# Patient Record
Sex: Female | Born: 1945
Health system: Southern US, Community
[De-identification: ages and names within clinical notes are randomized; demographics above are authoritative.]

## PROBLEM LIST (undated history)

## (undated) HISTORY — PX: CATARACT EXTRACTION: SUR2

## (undated) HISTORY — PX: LIPOMA EXCISION: SHX5283

---

## 1999-05-14 ENCOUNTER — Other Ambulatory Visit: Admission: RE | Admit: 1999-05-14 | Discharge: 1999-05-14 | Payer: Self-pay | Admitting: Family Medicine

## 1999-07-19 ENCOUNTER — Ambulatory Visit (HOSPITAL_COMMUNITY): Admission: RE | Admit: 1999-07-19 | Discharge: 1999-07-19 | Payer: Self-pay | Admitting: General Surgery

## 2004-04-26 ENCOUNTER — Other Ambulatory Visit: Admission: RE | Admit: 2004-04-26 | Discharge: 2004-04-26 | Payer: Self-pay | Admitting: Family Medicine

## 2014-09-04 ENCOUNTER — Telehealth: Payer: Self-pay | Admitting: Family Medicine

## 2014-09-04 NOTE — Telephone Encounter (Signed)
Patient was wanting to be seen today for her leg. She states that she felt something pop. Patient advised that we did not have any more appts for today and was offered an appointment for Thursday and she is just going to try urgent care.

## 2014-11-20 ENCOUNTER — Telehealth: Payer: Self-pay | Admitting: Family Medicine

## 2014-11-20 NOTE — Telephone Encounter (Signed)
Pt given appt 11/23/14 at 9:10 with Evelina Dun, pt aware to bring a copy of her insurance card and to arrive 30 minutes prior.

## 2014-11-23 ENCOUNTER — Encounter: Payer: Self-pay | Admitting: Family

## 2014-11-23 ENCOUNTER — Ambulatory Visit (INDEPENDENT_AMBULATORY_CARE_PROVIDER_SITE_OTHER): Payer: Medicare Other | Admitting: Family

## 2014-11-23 VITALS — BP 135/72 | HR 78 | Temp 97.8°F | Ht 62.0 in | Wt 154.4 lb

## 2014-11-23 DIAGNOSIS — S8392XA Sprain of unspecified site of left knee, initial encounter: Secondary | ICD-10-CM | POA: Diagnosis not present

## 2014-11-23 DIAGNOSIS — K6289 Other specified diseases of anus and rectum: Secondary | ICD-10-CM

## 2014-11-23 MED ORDER — MELOXICAM 7.5 MG PO TABS: 7.5000 mg | ORAL_TABLET | Freq: Every day | ORAL | Status: DC

## 2014-11-23 NOTE — Patient Instructions (Signed)
Lateral Collateral Knee Ligament Sprain With Phase I Rehab The lateral collateral ligament (LCL) of the knee helps hold the knee joint in proper alignment and prevents the bones from shifting out of alignment (displacing) toward the outside (laterally). Injury to the knee may cause a tear in the LCL ligament (sprain). The LCL is the least common ligament of the knee to be injured. Sprains may heal on their own, but they often result in a loose joint. Sprains are classified into three categories. Grade 1 sprains cause pain, but the tendon is not lengthened. Grade 2 sprains include a lengthened ligament, due to the ligament being stretched or partially ruptured. With grade 2 sprains there is still function, although the function may be decreased. Grade 3 sprains involve a complete tear of the tendon or muscle, and function is usually impaired. SYMPTOMS   Pain and tenderness on the outer side of the knee.  A "pop," tearing, or pulling sensation at the time of injury.  Bruising (contusion) at the site of injury within 48 hours of injury.  Knee stiffness.  Limping, often walking with the knee bent. CAUSES  An LCL sprain occurs when a force is placed on the ligament that is greater than it can handle. Common causes of injury include:  Direct hit (trauma) to the inner side of the knee, especially if the foot is planted on the ground.  Forceful pivoting of the body and leg while the foot is planted on the ground. RISK INCREASES WITH:  Contact sports (football, rugby).  Sports that require pivoting or cutting (soccer).  Poor knee strength and flexibility.  Improper equipment use. PREVENTION   Warm up and stretch properly before activity.  Maintain physical fitness:  Strength, flexibility, and endurance.  Cardiovascular fitness.  Wear properly fitted protective equipment (correct length of cleats for surface).  Functional braces may be effective in preventing injury. PROGNOSIS  If  treated properly, LCL tears usually heal on their own. Sometimes, surgery is required. RELATED COMPLICATIONS   Frequently recurring symptoms, such as knee giving way, instability, and swelling.  Injury to other structures in the knee joint.  Meniscal cartilage, resulting in locking and swelling of the knee.  Articular cartilage, resulting in knee arthritis.  Other ligaments of the knee (commonly).  Injury to nerves, causing numbness of the outer leg, foot, and ankle and weakness or paralysis, with inability to raise the ankle, big toe, or lesser toes.  Knee stiffness (loss of knee motion). TREATMENT  Treatment first involves the use of ice and medicine to reduce pain and inflammation. The use of strengthening and stretching exercises may help reduce pain with activity. These exercises may be performed at home, but referral to a therapist is often advised. You may be advised to walk with crutches until you are able to walk without a limp. Your caregiver may provide you with a hinged knee brace to help regain a full range of motion while also protecting the injured knee. For severe LCL injuries, or injuries that involve other ligaments of the knee, surgery is often advised. MEDICATION   If pain medicine is needed, nonsteroidal anti-inflammatory medicines (aspirin and ibuprofen), or other minor pain relievers (acetaminophen), are often advised.  Do not take pain medicine for 7 days before surgery.  Prescription pain relievers may be given, if your caregiver thinks they are needed. Use only as directed and only as much as you need. HEAT AND COLD  Cold treatment (icing) should be applied for 10 to 15 minutes  every 2 to 3 hours for inflammation and pain, and immediately after activity that aggravates your symptoms. Use ice packs or an ice massage.  Heat treatment may be used before performing stretching and strengthening activities prescribed by your caregiver, physical therapist, or athletic  trainer. Use a heat pack or a warm water soak. SEEK MEDICAL CARE IF:   Symptoms get worse or do not improve in 4 to 6 weeks, despite treatment.  New, unexplained symptoms develop. (Drugs used in treatment may produce side effects.) EXERCISES RANGE OF MOTION (ROM) AND STRETCHING EXERCISES - Lateral Collateral Knee Ligament Sprain Phase I These are some of the initial exercises that your physician, physical therapist or athletic trainer may have you perform to begin your rehabilitation. When you demonstrate gains in your flexibility and strength, your caregiver may progress you to Phase II exercises. As you perform these exercises, remember:   These initial exercises are intended to be gentle. They will help you restore motion without increasing any swelling.  Completing these exercises allows less painful movement and prepares you for the more aggressive strengthening exercises in Phase II.  An effective stretch should be held for at least 30 seconds.  A stretch should never be painful. You should only feel a gentle lengthening or release in the stretched tissue. RANGE OF MOTION - Knee Flexion, Active  Lie on your back with both knees straight. (If this causes back discomfort, bend your opposite knee, placing your foot flat on the floor.)  Slowly slide your heel back toward your buttocks until you feel a gentle stretch in the front of your knee or thigh.  Hold for __________ seconds. Slowly slide your heel back to the starting position. Repeat __________ times. Complete this exercise __________ times per day.  STRETCH - Knee Flexion, Supine  Lie on the floor with your right / left heel and foot lightly touching the wall. (Place both feet on the wall, if you do not use a door frame.)  Without using any effort, allow gravity to slide your foot down the wall slowly until you feel a gentle stretch in the front of your right / left knee.  Hold this stretch for __________ seconds. Then return  the leg to the starting position, using your healthy leg for help, if needed. Repeat __________ times. Complete this stretch __________ times per day.  RANGE OF MOTION - Knee Flexion and Extension, Active-Assisted  Sit on the edge of a table or chair with your thighs firmly supported. It may be helpful to place a folded towel under the end of your right / left thigh.  Flexion (bending): Place the ankle of your healthy leg on top of the other ankle. Use your healthy leg to gently bend your right / left knee until you feel a mild tension across the top of your knee.  Hold for __________ seconds.  Extension (straightening): Switch your ankles so your right / left leg is on top. Use your healthy leg to straighten your right / left knee until you feel a mild tension on the backside of your knee.  Hold for __________ seconds. Repeat __________ times. Complete this exercise __________ times per day. STRETCH - Knee Extension Sitting  Sit with your right / left leg/heel propped on another chair, coffee table, or foot stool.  Allow your leg muscles to relax, letting gravity straighten out your knee.*  You should feel a stretch behind your right / left knee. Hold this position for __________ seconds. Repeat __________ times.  Complete this stretch __________ times per day.  *Your physician, physical therapist, or athletic trainer may instruct you place a __________ weight on your thigh, just above your kneecap, to deepen the stretch.  STRENGTHENING EXERCISES Lateral Collateral Knee Ligament Sprain - Phase I These exercises may help you when beginning to rehabilitate your injury. They may resolve your symptoms with or without further involvement from your physician, physical therapist, or athletic trainer. While completing these exercises, remember:   Muscles can gain both the endurance and the strength needed for everyday activities through controlled exercises.  Complete these exercises as  instructed by your physician, physical therapist or athletic trainer. Increase the resistance and repetitions only as guided.  In order to return to more demanding activities, you will likely need to progress to more challenging exercises. Your physician, physical therapist or athletic trainer will advance your exercises when your tissues show adequate healing and your muscles demonstrate increased strength. STRENGTH - Quadriceps, Isometrics  Lie on your back with your right / left leg extended and your opposite knee bent.  Gradually tense the muscles in the front of your right / left thigh. You should see either your kneecap slide up toward your hip or increased dimpling just above the knee. This motion will push the back of the knee down toward the floor, mat, or bed on which you are lying.  Hold the muscle as tight as you can without increasing your pain for __________ seconds.  Relax the muscles slowly and completely between each repetition. Repeat __________ times. Complete this exercise __________ times per day.  STRENGTH - Quadriceps, Short Arcs   Lie on your back. Place a __________ inch towel roll under your right / left knee, so that the knee bends slightly.  Raise only your lower leg by tightening the muscles in the front of your thigh. Do not allow your thigh to rise.  Hold this position for __________ seconds. Repeat __________ times. Complete this exercise __________ times per day.  OPTIONAL ANKLE WEIGHTS: Begin with ____________________, but DO NOT exceed ____________________. Increase in 1 pound/0.5 kilogram increments. STRENGTH - Quadriceps, Straight Leg Raises  Quality counts! Watch for signs that the quadriceps muscle is working, to be sure you are strengthening the correct muscles and not "cheating" by substituting with healthier muscles.  Lie on your back with your right / left leg extended and your opposite knee bent.  Tense the muscles in the front of your right /  left thigh. You should see either your kneecap slide up or increased dimpling just above the knee. Your thigh may even shake a bit.  Tighten these muscles even more and raise your leg 4 to 6 inches off the floor. Hold for __________ seconds.  Keeping these muscles tense, lower your leg.  Relax the muscles slowly and completely in between each repetition. Repeat __________ times. Complete this exercise __________ times per day.  STRENGTH - Hamstring, Isometrics   Lie on your back, on a firm surface.  Bend your right / left knee approximately __________ degrees.  Dig your heel into the surface as if you are trying to pull it toward your buttocks. Tighten the muscles in the back of your thighs to "dig" as hard as you can, without increasing any pain.  Hold this position for __________ seconds.  Release the tension gradually and allow your muscle to completely relax for __________ seconds in between each exercise. Repeat __________ times. Complete this exercise __________ times per day.  STRENGTH - Hamstring,  Curls   Lie on your stomach with your legs extended. (If you lie on a bed, your feet may hang over the edge.)  Tighten the muscles in the back of your thigh to bend your right / left knee up to 90 degrees. Keep your hips flat on the bed.  Hold this position for __________ seconds.  Slowly lower your leg back to the starting position. Repeat __________ times. Complete this exercise __________ times per day.  OPTIONAL ANKLE WEIGHTS: Begin with ____________________, but DO NOT exceed ____________________. Increase in 1 pound/0.5 kilogram increments.   This information is not intended to replace advice given to you by your health care provider. Make sure you discuss any questions you have with your health care provider.   Document Released: 01/20/2005 Document Revised: 02/10/2014 Document Reviewed: 05/04/2008 Elsevier Interactive Patient Education [employer].

## 2014-11-23 NOTE — Progress Notes (Signed)
   Subjective:    Patient ID: Krista Price, female    DOB: Nov 15, 1945, 69 y.o.   MRN: [id number]    HPI Pt presents to the office today to establish care and left knee pain. Pt states on August 1 she twisted her knee going down the stairs. Pt went to the Urgent Care and had a negative x-ray. Pt went to [facility] and was told to rest. Pt states a doctor at her church told her she had fluid in it. Pt states she is having aching pain of a 2 out 10.   Review of Systems  Constitutional: Negative.   HENT: Negative.   Eyes: Negative.   Respiratory: Negative.  Negative for shortness of breath.   Cardiovascular: Negative.  Negative for palpitations.  Gastrointestinal: Negative.   Endocrine: Negative.   Genitourinary: Negative.   Musculoskeletal: Negative.   Neurological: Negative.  Negative for headaches.  Hematological: Negative.   Psychiatric/Behavioral: Negative.   All other systems reviewed and are negative.      Objective:   Physical Exam  Constitutional: She is oriented to person, place, and time. She appears well-developed and well-nourished. No distress.  HENT:  Head: Normocephalic and atraumatic.  Right Ear: External ear normal.  Mouth/Throat: Oropharynx is clear and moist.  Eyes: Pupils are equal, round, and reactive to light.  Neck: Normal range of motion. Neck supple. No thyromegaly present.  Cardiovascular: Normal rate, regular rhythm, normal heart sounds and intact distal pulses.   No murmur heard. Pulmonary/Chest: Effort normal and breath sounds normal. No respiratory distress. She has no wheezes.  Abdominal: Soft. Bowel sounds are normal. She exhibits no distension. There is no tenderness.  Musculoskeletal: Normal range of motion. She exhibits edema (trace amount of swelling in left knee). She exhibits no tenderness.  Right inner thigh mass approx 14cmX7cm  Neurological: She is alert and oriented to person, place, and time. She has normal reflexes. No  cranial nerve deficit.  Skin: Skin is warm and dry.  Psychiatric: She has a normal mood and affect. Her behavior is normal. Judgment and thought content normal.  Vitals reviewed.   BP 135/72 mmHg  Pulse 78  Temp(Src) 97.8 F (36.6 C) (Oral)  Ht $R'5\' 2"'EQ$  (1.575 m)  Wt 154 lb 6.4 oz (70.035 kg)  BMI 28.23 kg/m2       Assessment & Plan:  1. Knee sprain, left, initial encounter -Take Mobic with food -Exercises given to patient -Keep elevated when sitting -RTO in 2 weeks - CMP14+EGFR - meloxicam (MOBIC) 7.5 MG tablet; Take 1 tablet (7.5 mg total) by mouth daily.  Dispense: 30 tablet; Refill: 0  2. Mass of perirectal soft tissue Ultrasound pending - [place] Misc Soft Tissue; Future  Evelina Dun, FNP

## 2014-11-24 ENCOUNTER — Telehealth: Payer: Self-pay

## 2014-11-24 LAB — CMP14+EGFR
ALT: 27 IU/L (ref 0–32)
AST: 20 IU/L (ref 0–40)
Albumin/Globulin Ratio: 2 (ref 1.1–2.5)
Albumin: 4.7 g/dL (ref 3.5–4.8)
Alkaline Phosphatase: 79 IU/L (ref 39–117)
BILIRUBIN TOTAL: 0.4 mg/dL (ref 0.0–1.2)
BUN/Creatinine Ratio: 23 (ref 11–26)
BUN: 15 mg/dL (ref 8–27)
CHLORIDE: 99 mmol/L (ref 97–106)
CO2: 24 mmol/L (ref 18–29)
Calcium: 9.9 mg/dL (ref 8.7–10.3)
Creatinine, Ser: 0.64 mg/dL (ref 0.57–1.00)
GFR calc non Af Amer: 91 mL/min/{1.73_m2} (ref >59)
GFR, EST AFRICAN AMERICAN: 105 mL/min/{1.73_m2} (ref >59)
GLUCOSE: 76 mg/dL (ref 65–99)
Globulin, Total: 2.4 g/dL (ref 1.5–4.5)
POTASSIUM: 4.5 mmol/L (ref 3.5–5.2)
Sodium: 140 mmol/L (ref 136–144)
TOTAL PROTEIN: 7.1 g/dL (ref 6.0–8.5)

## 2014-11-24 NOTE — Telephone Encounter (Signed)
[facility] to x-ray Pt has an appointment 11/30/14 at 1:30 at [facility]; Arrive at 1:15 for registration; No prep

## 2014-11-24 NOTE — Telephone Encounter (Signed)
Pt aware of appointment date/time 

## 2014-11-30 ENCOUNTER — Ambulatory Visit (HOSPITAL_COMMUNITY)
Admission: RE | Admit: 2014-11-30 | Discharge: 2014-11-30 | Disposition: A | Discharge status: Home / Self Care | Payer: Medicare Other | Source: Ambulatory Visit | Attending: Family | Admitting: Family

## 2014-11-30 DIAGNOSIS — K6289 Other specified diseases of anus and rectum: Secondary | ICD-10-CM | POA: Diagnosis present

## 2014-12-01 ENCOUNTER — Telehealth: Payer: Self-pay | Admitting: Family

## 2014-12-01 ENCOUNTER — Other Ambulatory Visit: Payer: Self-pay | Admitting: Family

## 2014-12-02 NOTE — Telephone Encounter (Signed)
Notified pt of results of Ultrasound

## 2014-12-07 ENCOUNTER — Ambulatory Visit (INDEPENDENT_AMBULATORY_CARE_PROVIDER_SITE_OTHER): Payer: Medicare Other

## 2014-12-07 ENCOUNTER — Encounter: Payer: Self-pay | Admitting: Family

## 2014-12-07 ENCOUNTER — Ambulatory Visit (INDEPENDENT_AMBULATORY_CARE_PROVIDER_SITE_OTHER): Payer: Medicare Other | Admitting: Family

## 2014-12-07 VITALS — BP 119/63 | HR 78 | Temp 97.6°F | Ht 62.0 in | Wt 144.4 lb

## 2014-12-07 DIAGNOSIS — S8392XD Sprain of unspecified site of left knee, subsequent encounter: Secondary | ICD-10-CM | POA: Diagnosis not present

## 2014-12-07 DIAGNOSIS — Z780 Asymptomatic menopausal state: Secondary | ICD-10-CM

## 2014-12-07 DIAGNOSIS — D1739 Benign lipomatous neoplasm of skin and subcutaneous tissue of other sites: Secondary | ICD-10-CM

## 2014-12-07 DIAGNOSIS — D1720 Benign lipomatous neoplasm of skin and subcutaneous tissue of unspecified limb: Secondary | ICD-10-CM

## 2014-12-07 MED ORDER — KETOROLAC TROMETHAMINE 30 MG/ML IJ SOLN
30.0000 mg | Freq: Once | INTRAMUSCULAR | Status: AC
  Administered 2014-12-07: 30 mg via INTRAMUSCULAR

## 2014-12-07 MED ORDER — METHYLPREDNISOLONE ACETATE 80 MG/ML IJ SUSP
80.0000 mg | Freq: Once | INTRAMUSCULAR | Status: AC
  Administered 2014-12-07: 80 mg via INTRAMUSCULAR

## 2014-12-07 NOTE — Progress Notes (Signed)
   Subjective:    Patient ID: Krista Price, female    DOB: 10-19-1945, 69 y.o.   MRN: [id number]   HPI  Pt presents to the office today to discuss left knee pain. Pt was seen in the office on 11/23/14 and was diagnosed with left knee sprain and was given mobic 7.5 mg and told to rest. Pt reports feeling better, but still has pain at times when she is walking. Pt states her pain is a 2 out 10. Pt states when she does a lot of walking her pain increases. Pt states she goes to the [facility] and works out 30 mins two times a week.   She would also like to discuss her ultrasound results. Pt had an ultrasound of right inner thigh for that was a lipoma. Pt states she would like to has it removed.   Review of Systems  Constitutional: Negative.   HENT: Negative.   Eyes: Negative.   Respiratory: Negative.  Negative for shortness of breath.   Cardiovascular: Negative.  Negative for palpitations.  Gastrointestinal: Negative.   Endocrine: Negative.   Genitourinary: Negative.   Musculoskeletal: Negative.   Neurological: Negative.  Negative for headaches.  Hematological: Negative.   Psychiatric/Behavioral: Negative.   All other systems reviewed and are negative.      Objective:   Physical Exam  Constitutional: She is oriented to person, place, and time. She appears well-developed and well-nourished. No distress.  HENT:  Head: Normocephalic and atraumatic.  Right Ear: External ear normal.  Mouth/Throat: Oropharynx is clear and moist.  Eyes: Pupils are equal, round, and reactive to light.  Neck: Normal range of motion. Neck supple. No thyromegaly present.  Cardiovascular: Normal rate, regular rhythm, normal heart sounds and intact distal pulses.   No murmur heard. Pulmonary/Chest: Effort normal and breath sounds normal. No respiratory distress. She has no wheezes.  Abdominal: Soft. Bowel sounds are normal. She exhibits no distension. There is no tenderness.  Musculoskeletal: Normal range of  motion. She exhibits no edema or tenderness.  Neurological: She is alert and oriented to person, place, and time. She has normal reflexes. No cranial nerve deficit.  Skin: Skin is warm and dry.  Psychiatric: She has a normal mood and affect. Her behavior is normal. Judgment and thought content normal.  Vitals reviewed.   BP 119/63 mmHg  Pulse 78  Temp(Src) 97.6 F (36.4 C) (Oral)  Ht 5\' 2"  (1.575 m)  Wt 144 lb 6.4 oz (65.499 kg)  BMI 26.40 kg/m2       Assessment & Plan:  1. Left knee sprain, subsequent encounter -Rest -Exercises discussed -Continue with mobic -Pt declines PT at this time - ketorolac (TORADOL) 30 MG/ML injection 30 mg; Inject 1 mL (30 mg total) into the muscle once. - methylPREDNISolone acetate (DEPO-MEDROL) injection 80 mg; Inject 1 mL (80 mg total) into the muscle once.  2. Post-menopausal - DG Bone Density; Future  3. Lipoma of thigh - Ambulatory referral to General Surgery   Continue all meds Health Maintenance reviewed- Refuses all vaccines today Diet and exercise encouraged RTO as needed   Evelina Dun, FNP

## 2014-12-07 NOTE — Patient Instructions (Addendum)
Generic Knee Exercises EXERCISES RANGE OF MOTION (ROM) AND STRETCHING EXERCISES These exercises may help you when beginning to rehabilitate your injury. Your symptoms may resolve with or without further involvement from your physician, physical therapist, or athletic trainer. While completing these exercises, remember:   Restoring tissue flexibility helps normal motion to return to the joints. This allows healthier, less painful movement and activity.  An effective stretch should be held for at least 30 seconds.  A stretch should never be painful. You should only feel a gentle lengthening or release in the stretched tissue. STRETCH - Knee Extension, Prone  Lie on your stomach on a firm surface, such as a bed or countertop. Place your right / left knee and leg just beyond the edge of the surface. You may wish to place a towel under the far end of your right / left thigh for comfort.  Relax your leg muscles and allow gravity to straighten your knee. Your clinician may advise you to add an ankle weight if more resistance is helpful for you.  You should feel a stretch in the back of your right / left knee. Hold this position for __________ seconds. Repeat __________ times. Complete this stretch __________ times per day. * Your physician, physical therapist, or athletic trainer may ask you to add ankle weight to enhance your stretch.  RANGE OF MOTION - Knee Flexion, Active  Lie on your back with both knees straight. (If this causes back discomfort, bend your opposite knee, placing your foot flat on the floor.)  Slowly slide your heel back toward your buttocks until you feel a gentle stretch in the front of your knee or thigh.  Hold for __________ seconds. Slowly slide your heel back to the starting position. Repeat __________ times. Complete this exercise __________ times per day.  STRETCH - Quadriceps, Prone   Lie on your stomach on a firm surface, such as a bed or padded floor.  Bend your  right / left knee and grasp your ankle. If you are unable to reach your ankle or pant leg, use a belt around your foot to lengthen your reach.  Gently pull your heel toward your buttocks. Your knee should not slide out to the side. You should feel a stretch in the front of your thigh and/or knee.  Hold this position for __________ seconds. Repeat __________ times. Complete this stretch __________ times per day.  STRETCH - Hamstrings, Supine   Lie on your back. Loop a belt or towel over the ball of your right / left foot.  Straighten your right / left knee and slowly pull on the belt to raise your leg. Do not allow the right / left knee to bend. Keep your opposite leg flat on the floor.  Raise the leg until you feel a gentle stretch behind your right / left knee or thigh. Hold this position for __________ seconds. Repeat __________ times. Complete this stretch __________ times per day.  STRENGTHENING EXERCISES These exercises may help you when beginning to rehabilitate your injury. They may resolve your symptoms with or without further involvement from your physician, physical therapist, or athletic trainer. While completing these exercises, remember:   Muscles can gain both the endurance and the strength needed for everyday activities through controlled exercises.  Complete these exercises as instructed by your physician, physical therapist, or athletic trainer. Progress the resistance and repetitions only as guided.  You may experience muscle soreness or fatigue, but the pain or discomfort you are trying to   eliminate should never worsen during these exercises. If this pain does worsen, stop and make certain you are following the directions exactly. If the pain is still present after adjustments, discontinue the exercise until you can discuss the trouble with your clinician. STRENGTH - Quadriceps, Isometrics  Lie on your back with your right / left leg extended and your opposite knee  bent.  Gradually tense the muscles in the front of your right / left thigh. You should see either your knee cap slide up toward your hip or increased dimpling just above the knee. This motion will push the back of the knee down toward the floor/mat/bed on which you are lying.  Hold the muscle as tight as you can without increasing your pain for __________ seconds.  Relax the muscles slowly and completely in between each repetition. Repeat __________ times. Complete this exercise __________ times per day.  STRENGTH - Quadriceps, Short Arcs   Lie on your back. Place a __________ inch towel roll under your knee so that the knee slightly bends.  Raise only your lower leg by tightening the muscles in the front of your thigh. Do not allow your thigh to rise.  Hold this position for __________ seconds. Repeat __________ times. Complete this exercise __________ times per day.  OPTIONAL ANKLE WEIGHTS: Begin with ____________________, but DO NOT exceed ____________________. Increase in 1 pound/0.5 kilogram increments.  STRENGTH - Quadriceps, Straight Leg Raises  Quality counts! Watch for signs that the quadriceps muscle is working to insure you are strengthening the correct muscles and not "cheating" by substituting with healthier muscles.  Lay on your back with your right / left leg extended and your opposite knee bent.  Tense the muscles in the front of your right / left thigh. You should see either your knee cap slide up or increased dimpling just above the knee. Your thigh may even quiver.  Tighten these muscles even more and raise your leg 4 to 6 inches off the floor. Hold for __________ seconds.  Keeping these muscles tense, lower your leg.  Relax the muscles slowly and completely in between each repetition. Repeat __________ times. Complete this exercise __________ times per day.  STRENGTH - Hamstring, Curls  Lay on your stomach with your legs extended. (If you lay on a bed, your feet  may hang over the edge.)  Tighten the muscles in the back of your thigh to bend your right / left knee up to 90 degrees. Keep your hips flat on the bed/floor.  Hold this position for __________ seconds.  Slowly lower your leg back to the starting position. Repeat __________ times. Complete this exercise __________ times per day.  OPTIONAL ANKLE WEIGHTS: Begin with ____________________, but DO NOT exceed ____________________. Increase in 1 pound/0.5 kilogram increments.  STRENGTH - Quadriceps, Squats  Stand in a door frame so that your feet and knees are in line with the frame.  Use your hands for balance, not support, on the frame.  Slowly lower your weight, bending at the hips and knees. Keep your lower legs upright so that they are parallel with the door frame. Squat only within the range that does not increase your knee pain. Never let your hips drop below your knees.  Slowly return upright, pushing with your legs, not pulling with your hands. Repeat __________ times. Complete this exercise __________ times per day.  STRENGTH - Quadriceps, Wall Slides  Follow guidelines for form closely. Increased knee pain often results from poorly placed feet or knees.    Lean against a smooth wall or door and walk your feet out 18-24 inches. Place your feet hip-width apart.  Slowly slide down the wall or door until your knees bend __________ degrees.* Keep your knees over your heels, not your toes, and in line with your hips, not falling to either side.  Hold for __________ seconds. Stand up to rest for __________ seconds in between each repetition. Repeat __________ times. Complete this exercise __________ times per day. * Your physician, physical therapist, or athletic trainer will alter this angle based on your symptoms and progress.   This information is not intended to replace advice given to you by your health care provider. Make sure you discuss any questions you have with your health care  provider.   Document Released: 12/04/2004 Document Revised: 02/10/2014 Document Reviewed: 05/04/2008 Elsevier Interactive Patient Education 2016 [employer] Lipoma A lipoma is a noncancerous (benign) tumor that is made up of fat cells. This is a very common type of soft-tissue growth. Lipomas are usually found under the skin (subcutaneous). They may occur in any tissue of the body that contains fat. Common areas for lipomas to appear include the back, shoulders, buttocks, and thighs. Lipomas grow slowly, and they are usually painless. Most lipomas do not cause problems and do not require treatment. CAUSES The cause of this condition is not known. RISK FACTORS This condition is more likely to develop in:  People who are 34-82 years old.  People who have a family history of lipomas. SYMPTOMS A lipoma usually appears as a small, round bump under the skin. It may feel soft or rubbery, but the firmness can vary. Most lipomas are not painful. However, a lipoma may become painful if it is located in an area where it pushes on nerves. DIAGNOSIS A lipoma can usually be diagnosed with a physical exam. You may also have tests to confirm the diagnosis and to rule out other conditions. Tests may include:  Imaging tests, such as a CT scan or MRI.  Removal of a tissue sample to be looked at under a microscope (biopsy). TREATMENT Treatment is not needed for small lipomas that are not causing problems. If a lipoma continues to get bigger or it causes problems, removal is often the best option. Lipomas can also be removed to improve appearance. Removal of a lipoma is usually done with a surgery in which the fatty cells and the surrounding capsule are removed. Most often, a medicine that numbs the area (local anesthetic) is used for this procedure. HOME CARE INSTRUCTIONS  Keep all follow-up visits as directed by your health care provider. This is important. SEEK MEDICAL CARE IF:  Your lipoma becomes  larger or hard.  Your lipoma becomes painful, red, or increasingly swollen. These could be signs of infection or a more serious condition.   This information is not intended to replace advice given to you by your health care provider. Make sure you discuss any questions you have with your health care provider.   Document Released: 01/10/2002 Document Revised: 06/06/2014 Document Reviewed: 01/16/2014 Elsevier Interactive Patient Education [employer].

## 2014-12-15 ENCOUNTER — Encounter: Payer: Self-pay | Admitting: Pharmacist

## 2014-12-15 ENCOUNTER — Ambulatory Visit (INDEPENDENT_AMBULATORY_CARE_PROVIDER_SITE_OTHER): Payer: Medicare Other | Admitting: Pharmacist

## 2014-12-15 VITALS — Ht 62.0 in | Wt 144.5 lb

## 2014-12-15 DIAGNOSIS — M8580 Other specified disorders of bone density and structure, unspecified site: Secondary | ICD-10-CM

## 2014-12-15 NOTE — Patient Instructions (Signed)
Fall Prevention in the Home  Falls can cause injuries and can affect people from all age groups. There are many simple things that you can do to make your home safe and to help prevent falls. WHAT CAN I DO ON THE OUTSIDE OF MY HOME?  Regularly repair the edges of walkways and driveways and fix any cracks.  Remove high doorway thresholds.  Trim any shrubbery on the main path into your home.  Use bright outdoor lighting.  Clear walkways of debris and clutter, including tools and rocks.  Regularly check that handrails are securely fastened and in good repair. Both sides of any steps should have handrails.  Install guardrails along the edges of any raised decks or porches.  Have leaves, snow, and ice cleared regularly.  Use sand or salt on walkways during winter months.  In the garage, clean up any spills right away, including grease or oil spills. WHAT CAN I DO IN THE BATHROOM?  Use night lights.  Install grab bars by the toilet and in the tub and shower. Do not use towel bars as grab bars.  Use non-skid mats or decals on the floor of the tub or shower.  If you need to sit down while you are in the shower, use a plastic, non-slip stool..  Keep the floor dry. Immediately clean up any water that spills on the floor.  Remove soap buildup in the tub or shower on a regular basis.  Attach bath mats securely with double-sided non-slip rug tape.  Remove throw rugs and other tripping hazards from the floor. WHAT CAN I DO IN THE BEDROOM?  Use night lights.  Make sure that a bedside light is easy to reach.  Do not use oversized bedding that drapes onto the floor.  Have a firm chair that has side arms to use for getting dressed.  Remove throw rugs and other tripping hazards from the floor. WHAT CAN I DO IN THE KITCHEN?   Clean up any spills right away.  Avoid walking on wet floors.  Place frequently used items in easy-to-reach places.  If you need to reach for something  above you, use a sturdy step stool that has a grab bar.  Keep electrical cables out of the way.  Do not use floor polish or wax that makes floors slippery. If you have to use wax, make sure that it is non-skid floor wax.  Remove throw rugs and other tripping hazards from the floor. WHAT CAN I DO IN THE STAIRWAYS?  Do not leave any items on the stairs.  Make sure that there are handrails on both sides of the stairs. Fix handrails that are broken or loose. Make sure that handrails are as long as the stairways.  Check any carpeting to make sure that it is firmly attached to the stairs. Fix any carpet that is loose or worn.  Avoid having throw rugs at the top or bottom of stairways, or secure the rugs with carpet tape to prevent them from moving.  Make sure that you have a light switch at the top of the stairs and the bottom of the stairs. If you do not have them, have them installed. WHAT ARE SOME OTHER FALL PREVENTION TIPS?  Wear closed-toe shoes that fit well and support your feet. Wear shoes that have rubber soles or low heels.  When you use a stepladder, make sure that it is completely opened and that the sides are firmly locked. Have someone hold the ladder while you   are using it. Do not climb a closed stepladder.  Add color or contrast paint or tape to grab bars and handrails in your home. Place contrasting color strips on the first and last steps.  Use mobility aids as needed, such as canes, walkers, scooters, and crutches.  Turn on lights if it is dark. Replace any light bulbs that burn out.  Set up furniture so that there are clear paths. Keep the furniture in the same spot.  Fix any uneven floor surfaces.  Choose a carpet design that does not hide the edge of steps of a stairway.  Be aware of any and all pets.  Review your medicines with your healthcare provider. Some medicines can cause dizziness or changes in blood pressure, which increase your risk of falling. Talk  with your health care provider about other ways that you can decrease your risk of falls. This may include working with a physical therapist or trainer to improve your strength, balance, and endurance.   This information is not intended to replace advice given to you by your health care provider. Make sure you discuss any questions you have with your health care provider.   Document Released: 01/10/2002 Document Revised: 06/06/2014 Document Reviewed: 02/24/2014 Elsevier Interactive Patient Education 2016 [employer] Inc[employer]                Exercise for Strong Bones  Exercise is important to build and maintain strong bones / bone density.  There are 2 types of exercises that are important to building and maintaining strong bones:  Weight- bearing and muscle-stregthening.  Weight-bearing Exercises  These exercises include activities that make you move against gravity while staying upright. Weight-bearing exercises can be high-impact or low-impact.  High-impact weight-bearing exercises help build bones and keep them strong. If you have broken a bone due to osteoporosis or are at risk of breaking a bone, you may need to avoid high-impact exercises. If you're not sure, you should check with your healthcare provider.  Examples of high-impact weight-bearing exercises are: Dancing  Doing high-impact aerobics  Hiking  Jogging/running  Jumping Rope  Stair climbing  Tennis  Low-impact weight-bearing exercises can also help keep bones strong and are a safe alternative if you cannot do high-impact exercises.   Examples of low-impact weight-bearing exercises are: Using elliptical training machines  Doing low-impact aerobics  Using stair-step machines  Fast walking on a treadmill or outside   Muscle-Strengthening Exercises These exercises include activities where you move your body, a weight or some other resistance against gravity. They are also known as resistance exercises and include: Lifting  weights  Using elastic exercise bands  Using weight machines  Lifting your own body weight  Functional movements, such as standing and rising up on your toes  Yoga and Pilates can also improve strength, balance and flexibility. However, certain positions may not be safe for people with osteoporosis or those at increased risk of broken bones. For example, exercises that have you bend forward may increase the chance of breaking a bone in the spine.   Non-Impact Exercises There are other types of exercises that can help prevent falls.  Non-impact exercises can help you to improve balance, posture and how well you move in everyday activities. Some of these exercises include: Balance exercises that strengthen your legs and test your balance, such as Tai Chi, can decrease your risk of falls.  Posture exercises that improve your posture and reduce rounded or "sloping" shoulders can help you decrease the chance   breaking a bone, especially in the spine.  Functional exercises that improve how well you move can help you with everyday activities and decrease your chance of falling and breaking a bone. For example, if you have trouble getting up from a chair or climbing stairs, you should do these activities as exercises.   **A physical therapist can teach you balance, posture and functional exercises. He/she can also help you learn which exercises are safe and appropriate for you.  [facility] has a physical therapy office in [place] in front of our office and referrals can be made for assessments and treatment as needed and strength and balance training.  If you would like to have an assessment with [facility] and our physical therapy team please let a nurse or provider know.  Calcium & Vitamin D: The Facts  Why is calcium and vitamin D consumption important? Calcium: . Most Americans do not consume adequate amounts of calcium! Calcium is required for proper muscle function, nerve communication, bone  support, and many other functions in the body.  . The body uses bones as a source of calcium. Bones 'remodel' themselves continuously - the body constantly breaks bone down to release calcium and rebuilds bones by replacing calcium in the bone later.  . As we get older, the rate of bone breakdown occurs faster than bone rebuilding which could lead to osteopenia, osteoporosis, and possible fractures.   Vitamin D: . People naturally make vitamin D in the body when sunlight hits the skin and triggers a process that leads to vitamin D production. This natural vitamin D production requires about 10-15 minutes of sun exposure on the hands, arms, and face at least 2-3 times per week. However, due to decreased sun exposure and the use of sunscreen, most people will need to get additional vitamin D from foods or supplements. Your doctor can measure your body's vitamin D level through a simple blood test to determine your daily vitamin D needs.  . Vitamin D is used to help the body absorb calcium, maintain bone health, help the immune system, and reduce inflammation. It also plays a role in muscle performance, balance and risk of falling.  . Vitamin D deficiency can lead to osteomalacia or softening of the bones, bone pain, and muscle weakness.   The recommended daily allowance of Calcium and Vitamin D varies for different age groups. Age group Calcium (mg) Vitamin D (IU)  Females and Males: Age 47-50 1000 mg 600 IU  Females: Age 86- 52 1200 mg 600 IU  Males: Age 39-70 1000 mg 600 IU  Females and Males: Age 60+ 1200 mg 800 IU  Pregnant/lactating Females age 61-50 1000 mg 600 IU   How much Calcium do you get in your diet? Calcium Intake # of servings per day  Total calcium (mg)  Skim milk, 2% milk (1 cup) _________ x 300 mg   Yogurt (1 small container) _________ x 200 mg   Cheese (1oz) _________ x 200 mg   Cottage Cheese (1 cup)             ________ x 150 mg   Almond milk (1 cup) _________ x 450 mg    Fortified Orange Juice (1 cup) _________ x 300 mg   Broccoli or spinach ( 1 cup) _________ x 100 mg   Salmon (3 oz) _________ x 150 mg    Almonds (1/4 cup) _______ x 90 mg      How do we get Calcium and Vitamin D in  our diet? Calcium: . Obtaining calcium from the diet is the most preferred way to reach the recommended daily goal. If this goal is not reached through diet, calcium supplements are available.  . Calcium is found in many foods including: dairy products, dark leafy vegetables (like broccoli, kale, and spinach), fish, and fortified products like juices and cereals.  . The food label will have a %DV (percent daily value) listed showing the amount of calcium per serving. To determine the total mg per serving, simply replace the % with zero (0).  For example, Almond Breeze almond milk contains 45% DV of calcium or 485m per 1 cup.  . You can increase the amount of calcium in your diet by using more calcium products in your daily meals. Use yogurt and fruit to make smoothies or use yogurt to top baked potatoes or make whipped potatoes. Sprinkle low fat cheese onto salads or into egg white omelets. You can even add non-fat dry milk powder (3025mcalcium per 1/3 cup) to hot cereals, meat loaf, soups, or potatoes.  . Calcium supplements come in many forms including tablets, chewables, and gummies. Be sure to read the label to determine the correct number of tablets per serving and whether or not to take the supplement with food.  . Calcium carbonate products (Oscal, Caltrate, and Viactiv) are generally better absorbed when taken with food while calcium citrate products like Citracal can be taken with or without food.  . The body can only absorb about 600 mg of calcium at one time. It is recommended to take calcium supplements in small amounts several times per day.  However, taking it all at once is better than not taking it at all. . Increasing your intake of calcium is essential for bone  health, but may also lead to some side effects like constipation, increased gas, bloating or abdominal cramping. To help reduce these side effects, start with 1 tablet per day and slowly increase your intake of the supplement to the recommended doses. It is also recommended that you drink plenty of water each day. Vitamin D: . Very few foods naturally contain vitamin D. However, it is found in saltwater fish (like tuna, salmon and mackerel), beef liver, egg yolks, cheese and vitamin D fortified foods (like yogurt, cereals, orange juice and milk) . The amount of vitamin D in each food or product is listed as %DV on the product label. To determine the total amount of vitamin D per serving, drop the % sign and multiply the number by 4. For example, 1 cup of Almond Breeze almond milk contains 25% DV vitamin D or 100 IU per serving (25 x 4 =100). . Vitamin D is also found in multivitamins and supplements and may be listed as ergocalciferol (vitamin D2) or cholecalciferol (vitamin D3). Each of these forms of vitamin D are equivalent and the daily recommended intake will vary based on your age and the vitamin D levels in your body. Follow your doctor's recommendation for vitamin D intake.

## 2014-12-15 NOTE — Progress Notes (Signed)
Patient ID: Krista Price, female   DOB: 23-Nov-1945, 69 y.o.   MRN: [id number]  [facility] Current Height: Height: 5\' 2"  (157.5 cm)      Current Weight: Weight: 144 lb 8 oz (65.545 kg)       Ethnicity:Caucasian    HPI: Does pt already have a diagnosis of:  Osteopenia?  No Osteoporosis?  No  Back Pain?  No  - only when lifting;  Sees chiropractor monthly     Kyphosis?  No Prior fracture?  No Med(s) for Osteoporosis/Osteopenia:  Calcium and vitamin D Med(s) previously tried for Osteoporosis/Osteopenia:  none                                                             PMH: Age at menopause:  Around 69 yo Hysterectomy?  No Oophorectomy?  No HRT? No Steroid Use?  No Thyroid med?  No History of cancer?  Yes  - skin cancer on left leg - removed surgically History of digestive disorders (ie Crohn's)?  No Current or previous eating disorders?  No Last Vitamin D Result:  Never checked here Last GFR Result:  Greater than 59 (11/23/2014)   FH/SH: Family history of osteoporosis?  No Parent with history of hip fracture?  No Family history of breast cancer?  Yes - mother (diagnosed at 7yo) Exercise?  Yes - Zumba, ellipitical, weights, walking Smoking?  No Alcohol?  No    Calcium Assessment Calcium Intake  # of servings/day  Calcium mg  Milk (8 oz) 0  x  300  = 0  Yogurt (4 oz) 0 x  200 = 0  Cheese (1 oz) 0 x  200 = 0  Other Calcium sources   250mg   Ca supplement Calcium supplement; MVI = 1000mg    Estimated calcium intake per day 1250mg     DEXA Results Date of Test T-Score for AP Spine L1-L4 T-Score for Total Left Hip T-Score for Total Right Hip  12/07/2014 1.5 -0.5 -0.6                  Lowest T-Score = -2.0 for neck of right hip  FRAX 10 year estimate: Total FX risk:  12%  (consider medication if >/= 20%) Hip FX risk:  2.3%  (consider medication if >/= 3%)  Assessment: Osteopenia with low FRAX estimate for fracture  Recommendations: 1.   Discussed BMD   / DEXA results and discussed fracture risk.  2.  continue calcium 1200mg  daily through supplementation or diet.  3.  continue weight bearing exercise - 30 minutes at least 4 days per week.   4.  Counseled and educated about fall risk and prevention.  Recheck DEXA:  2 years  Time spent counseling patient:  25 minutes   Cherre Robins, PharmD, CPP

## 2014-12-16 LAB — VITAMIN D 25 HYDROXY (VIT D DEFICIENCY, FRACTURES): VIT D 25 HYDROXY: 30.8 ng/mL (ref 30.0–100.0)

## 2014-12-18 NOTE — Progress Notes (Signed)
Patient informed and voices understanding 

## 2015-01-15 LAB — HM MAMMOGRAPHY: HM Mammogram: NEGATIVE

## 2015-01-22 ENCOUNTER — Encounter: Payer: Self-pay | Admitting: *Deleted

## 2015-03-12 DIAGNOSIS — M9902 Segmental and somatic dysfunction of thoracic region: Secondary | ICD-10-CM | POA: Diagnosis not present

## 2015-03-12 DIAGNOSIS — M9901 Segmental and somatic dysfunction of cervical region: Secondary | ICD-10-CM | POA: Diagnosis not present

## 2015-03-12 DIAGNOSIS — M9903 Segmental and somatic dysfunction of lumbar region: Secondary | ICD-10-CM | POA: Diagnosis not present

## 2015-05-07 DIAGNOSIS — M9902 Segmental and somatic dysfunction of thoracic region: Secondary | ICD-10-CM | POA: Diagnosis not present

## 2015-05-07 DIAGNOSIS — M9903 Segmental and somatic dysfunction of lumbar region: Secondary | ICD-10-CM | POA: Diagnosis not present

## 2015-05-07 DIAGNOSIS — M9901 Segmental and somatic dysfunction of cervical region: Secondary | ICD-10-CM | POA: Diagnosis not present

## 2015-06-04 DIAGNOSIS — M9901 Segmental and somatic dysfunction of cervical region: Secondary | ICD-10-CM | POA: Diagnosis not present

## 2015-06-04 DIAGNOSIS — M9902 Segmental and somatic dysfunction of thoracic region: Secondary | ICD-10-CM | POA: Diagnosis not present

## 2015-06-04 DIAGNOSIS — M9903 Segmental and somatic dysfunction of lumbar region: Secondary | ICD-10-CM | POA: Diagnosis not present

## 2015-07-09 DIAGNOSIS — M9902 Segmental and somatic dysfunction of thoracic region: Secondary | ICD-10-CM | POA: Diagnosis not present

## 2015-07-09 DIAGNOSIS — M9903 Segmental and somatic dysfunction of lumbar region: Secondary | ICD-10-CM | POA: Diagnosis not present

## 2015-07-09 DIAGNOSIS — M9901 Segmental and somatic dysfunction of cervical region: Secondary | ICD-10-CM | POA: Diagnosis not present

## 2015-07-23 DIAGNOSIS — H52223 Regular astigmatism, bilateral: Secondary | ICD-10-CM | POA: Diagnosis not present

## 2015-07-23 DIAGNOSIS — H5203 Hypermetropia, bilateral: Secondary | ICD-10-CM | POA: Diagnosis not present

## 2015-07-23 DIAGNOSIS — H524 Presbyopia: Secondary | ICD-10-CM | POA: Diagnosis not present

## 2015-07-23 DIAGNOSIS — Z9849 Cataract extraction status, unspecified eye: Secondary | ICD-10-CM | POA: Diagnosis not present

## 2015-07-23 DIAGNOSIS — H43393 Other vitreous opacities, bilateral: Secondary | ICD-10-CM | POA: Diagnosis not present

## 2015-07-23 DIAGNOSIS — Z961 Presence of intraocular lens: Secondary | ICD-10-CM | POA: Diagnosis not present

## 2015-08-13 DIAGNOSIS — M9901 Segmental and somatic dysfunction of cervical region: Secondary | ICD-10-CM | POA: Diagnosis not present

## 2015-08-13 DIAGNOSIS — M9902 Segmental and somatic dysfunction of thoracic region: Secondary | ICD-10-CM | POA: Diagnosis not present

## 2015-08-13 DIAGNOSIS — M9903 Segmental and somatic dysfunction of lumbar region: Secondary | ICD-10-CM | POA: Diagnosis not present

## 2015-09-10 DIAGNOSIS — M9903 Segmental and somatic dysfunction of lumbar region: Secondary | ICD-10-CM | POA: Diagnosis not present

## 2015-09-10 DIAGNOSIS — M9902 Segmental and somatic dysfunction of thoracic region: Secondary | ICD-10-CM | POA: Diagnosis not present

## 2015-09-10 DIAGNOSIS — M9901 Segmental and somatic dysfunction of cervical region: Secondary | ICD-10-CM | POA: Diagnosis not present

## 2015-11-19 DIAGNOSIS — M9903 Segmental and somatic dysfunction of lumbar region: Secondary | ICD-10-CM | POA: Diagnosis not present

## 2015-11-19 DIAGNOSIS — M9902 Segmental and somatic dysfunction of thoracic region: Secondary | ICD-10-CM | POA: Diagnosis not present

## 2015-11-19 DIAGNOSIS — M9901 Segmental and somatic dysfunction of cervical region: Secondary | ICD-10-CM | POA: Diagnosis not present

## 2015-12-17 DIAGNOSIS — M9901 Segmental and somatic dysfunction of cervical region: Secondary | ICD-10-CM | POA: Diagnosis not present

## 2015-12-17 DIAGNOSIS — M9903 Segmental and somatic dysfunction of lumbar region: Secondary | ICD-10-CM | POA: Diagnosis not present

## 2015-12-17 DIAGNOSIS — M9902 Segmental and somatic dysfunction of thoracic region: Secondary | ICD-10-CM | POA: Diagnosis not present

## 2015-12-19 DIAGNOSIS — M9901 Segmental and somatic dysfunction of cervical region: Secondary | ICD-10-CM | POA: Diagnosis not present

## 2015-12-19 DIAGNOSIS — M9902 Segmental and somatic dysfunction of thoracic region: Secondary | ICD-10-CM | POA: Diagnosis not present

## 2015-12-19 DIAGNOSIS — M9903 Segmental and somatic dysfunction of lumbar region: Secondary | ICD-10-CM | POA: Diagnosis not present

## 2016-01-14 DIAGNOSIS — M9902 Segmental and somatic dysfunction of thoracic region: Secondary | ICD-10-CM | POA: Diagnosis not present

## 2016-01-14 DIAGNOSIS — M9901 Segmental and somatic dysfunction of cervical region: Secondary | ICD-10-CM | POA: Diagnosis not present

## 2016-01-14 DIAGNOSIS — M9903 Segmental and somatic dysfunction of lumbar region: Secondary | ICD-10-CM | POA: Diagnosis not present

## 2016-01-16 DIAGNOSIS — Z1231 Encounter for screening mammogram for malignant neoplasm of breast: Secondary | ICD-10-CM | POA: Diagnosis not present

## 2016-02-11 DIAGNOSIS — M9903 Segmental and somatic dysfunction of lumbar region: Secondary | ICD-10-CM | POA: Diagnosis not present

## 2016-02-11 DIAGNOSIS — M9902 Segmental and somatic dysfunction of thoracic region: Secondary | ICD-10-CM | POA: Diagnosis not present

## 2016-02-11 DIAGNOSIS — M9901 Segmental and somatic dysfunction of cervical region: Secondary | ICD-10-CM | POA: Diagnosis not present

## 2016-03-10 DIAGNOSIS — M9902 Segmental and somatic dysfunction of thoracic region: Secondary | ICD-10-CM | POA: Diagnosis not present

## 2016-03-10 DIAGNOSIS — M9901 Segmental and somatic dysfunction of cervical region: Secondary | ICD-10-CM | POA: Diagnosis not present

## 2016-03-10 DIAGNOSIS — M9903 Segmental and somatic dysfunction of lumbar region: Secondary | ICD-10-CM | POA: Diagnosis not present

## 2016-05-19 DIAGNOSIS — M9903 Segmental and somatic dysfunction of lumbar region: Secondary | ICD-10-CM | POA: Diagnosis not present

## 2016-05-19 DIAGNOSIS — M9901 Segmental and somatic dysfunction of cervical region: Secondary | ICD-10-CM | POA: Diagnosis not present

## 2016-05-19 DIAGNOSIS — M9902 Segmental and somatic dysfunction of thoracic region: Secondary | ICD-10-CM | POA: Diagnosis not present

## 2016-06-16 DIAGNOSIS — M9903 Segmental and somatic dysfunction of lumbar region: Secondary | ICD-10-CM | POA: Diagnosis not present

## 2016-06-16 DIAGNOSIS — M9901 Segmental and somatic dysfunction of cervical region: Secondary | ICD-10-CM | POA: Diagnosis not present

## 2016-06-16 DIAGNOSIS — M9902 Segmental and somatic dysfunction of thoracic region: Secondary | ICD-10-CM | POA: Diagnosis not present

## 2016-07-07 ENCOUNTER — Telehealth: Payer: Self-pay | Admitting: Family

## 2016-07-07 NOTE — Telephone Encounter (Signed)
Scheduled

## 2016-07-14 DIAGNOSIS — M9901 Segmental and somatic dysfunction of cervical region: Secondary | ICD-10-CM | POA: Diagnosis not present

## 2016-07-14 DIAGNOSIS — M9902 Segmental and somatic dysfunction of thoracic region: Secondary | ICD-10-CM | POA: Diagnosis not present

## 2016-07-14 DIAGNOSIS — M9903 Segmental and somatic dysfunction of lumbar region: Secondary | ICD-10-CM | POA: Diagnosis not present

## 2016-07-21 ENCOUNTER — Ambulatory Visit (INDEPENDENT_AMBULATORY_CARE_PROVIDER_SITE_OTHER): Payer: PPO | Admitting: *Deleted

## 2016-07-21 DIAGNOSIS — Z0000 Encounter for general adult medical examination without abnormal findings: Secondary | ICD-10-CM

## 2016-07-21 NOTE — Progress Notes (Signed)
Subjective:   Krista Price is a 71 y.o. female who presents for an Initial Medicare Annual Wellness Visit.  She is retired from [employer] in 2013.  She enjoys reading, exercising, yard work.  Patient has one son and one daughter, one grandson and one granddaughter. She babysits her granddaughter and grandson four days per week.  Krista Price attends Krista Price.  She lives at home with her husband who is currently going through cancer treatments.  She feels her health is about the same as it was last year, she has had no hospitalizations, ER visits, or surgeries in the past year.    Review of Systems    Patient has no complaints today.  All systems negative         Objective:    Today's Vitals   07/21/16 1550  BP: 127/73  Pulse: 71  Weight: 151 lb (68.5 kg)  Height: 5' (1.524 m)  PainSc: 0-No pain   Body mass index is 29.49 kg/m.   Current Medications (verified) Outpatient Encounter Prescriptions as of 07/21/2016  Medication Sig  . Aloe LIQD Take by mouth daily.  . Multiple Vitamins-Minerals (MULTIPLE VITAMINS/WOMENS PO) Take 1 tablet by mouth daily.  . Calcium Carbonate-Vit D-Min (CALCIUM 600 + MINERALS) 600-200 MG-UNIT TABS Take 1 tablet by mouth daily.  Marland Kitchen glucosamine-chondroitin 500-400 MG tablet Take 1 tablet by mouth 2 (two) times daily.  . meloxicam (MOBIC) 7.5 MG tablet Take 1 tablet (7.5 mg total) by mouth daily. (Patient not taking: Reported on 07/21/2016)  . Vitamin D, Cholecalciferol, 1000 UNITS TABS Take 1,000 Units by mouth daily.   No facility-administered encounter medications on file as of 07/21/2016.     Allergies (verified) Patient has no known allergies.   History: No past medical history on file. No past surgical history on file. Family History  Problem Relation Age of Onset  . Cancer Mother        breast  . Cancer Father   . Heart disease Father    Social History   Occupational History  . Not on file.   Social  History Main Topics  . Smoking status: Never Smoker  . Smokeless tobacco: Never Used  . Alcohol use No  . Drug use: No  . Sexual activity: Not on file    Tobacco Counseling Counseling given: No   Activities of Daily Living In your present state of health, do you have any difficulty performing the following activities: 07/21/2016  Hearing? N  Vision? N  Difficulty concentrating or making decisions? N  Walking or climbing stairs? N  Dressing or bathing? N  Doing errands, shopping? N  Some recent data might be hidden    Immunizations and Health Maintenance  There is no immunization history on file for this patient. Health Maintenance Due  Topic Date Due  . Hepatitis C Screening  1946-01-27  . TETANUS/TDAP  04/13/1963  . COLONOSCOPY  04/13/1994  . PNA vac Low Risk Adult (1 of 2 - PCV13) 04/12/2009    Patient Care Team: Sharion Balloon, FNP as PCP - General (Family Medicine)       Assessment:   This is a routine wellness examination for Krista Price.   Hearing/Vision screen  She had cataracts removed in 2013, and is followed by Krista Price in [name] bi annually No hearing deficits noted  Dietary issues and exercise activities discussed: Current Exercise Habits: Structured exercise class;Home exercise routine, Type of exercise: calisthenics;exercise ball;strength training/weights;stretching;walking, Time (Minutes): > 60,  Frequency (Times/Week): 5, Weekly Exercise (Minutes/Week): 0, Intensity: Intense  Patient eats a diet including mostly lean proteins, whole grains, vegetables, and fruits.   Goals    None     Maintain healthy diet and exercise regimen   Depression Screen PHQ 2/9 Scores 07/21/2016 11/23/2014  PHQ - 2 Score 0 0    Fall Risk Fall Risk  07/21/2016 11/23/2014  Falls in the past year? No No    Cognitive Function: MMSE - Mini Mental State Exam 07/21/2016  Orientation to time 5  Orientation to Place 5  Registration 3  Attention/ Calculation 5    Recall 3  Language- name 2 objects 2  Language- repeat 1  Language- follow 3 step command 3  Language- read & follow direction 1  Write a sentence 1  Copy design 1  Total score 30        Screening Tests Health Maintenance  Topic Date Due  . Hepatitis C Screening  [birth year]  . TETANUS/TDAP  04/13/1963  . COLONOSCOPY  04/13/1994  . PNA vac Low Risk Adult (1 of 2 - PCV13) 04/12/2009  . INFLUENZA VACCINE  09/03/2016  . DEXA SCAN  12/06/2016  . MAMMOGRAM  01/15/2018   Declined tdap, colonoscopy, and prevnar today     Plan:      Follow up with Krista Price, Krista Price on 09/01/16 for complete physical.   I have personally reviewed and noted the following in the patient's chart:   . Medical and social history . Use of alcohol, tobacco or illicit drugs  . Current medications and supplements . Functional ability and status . Nutritional status . Physical activity . Advanced directives . List of other physicians . Hospitalizations, surgeries, and ER visits in previous 12 months . Vitals . Screenings to include cognitive, depression, and falls . Referrals and appointments  In addition, I have reviewed and discussed with patient certain preventive protocols, quality metrics, and best practice recommendations. A written personalized care plan for preventive services as well as general preventive health recommendations were provided to patient.     Seraya Jobst M, RN   07/21/2016   I have reviewed and agree with the above AWV documentation.   Evelina Dun, FNP

## 2016-07-21 NOTE — Patient Instructions (Signed)
Thank you for coming in for your Annual Wellness Visit today!  Continue the great work with your diet and exercise regimen.   You have been scheduled for a physical with [name], FNP on 09/01/16 at 11 am.   Preventive Care 65 Years and Older, Female Preventive care refers to lifestyle choices and visits with your health care provider that can promote health and wellness. What does preventive care include?  A yearly physical exam. This is also called an annual well check.  Dental exams once or twice a year.  Routine eye exams. Ask your health care provider how often you should have your eyes checked.  Personal lifestyle choices, including: ? Daily care of your teeth and gums. ? Regular physical activity. ? Eating a healthy diet. ? Avoiding tobacco and drug use. ? Limiting alcohol use. ? Practicing safe sex. ? Taking low-dose aspirin every day. ? Taking vitamin and mineral supplements as recommended by your health care provider. What happens during an annual well check? The services and screenings done by your health care provider during your annual well check will depend on your age, overall health, lifestyle risk factors, and family history of disease. Counseling Your health care provider may ask you questions about your:  Alcohol use.  Tobacco use.  Drug use.  Emotional well-being.  Home and relationship well-being.  Sexual activity.  Eating habits.  History of falls.  Memory and ability to understand (cognition).  Work and work Statistician.  Reproductive health.  Screening You may have the following tests or measurements:  Height, weight, and BMI.  Blood pressure.  Lipid and cholesterol levels. These may be checked every 5 years, or more frequently if you are over 71 years old.  Skin check.  Lung cancer screening. You may have this screening every year starting at age 27 if you have a 30-pack-year history of smoking and currently smoke or  have quit within the past 15 years.  Fecal occult blood test (FOBT) of the stool. You may have this test every year starting at age 86.  Flexible sigmoidoscopy or colonoscopy. You may have a sigmoidoscopy every 5 years or a colonoscopy every 10 years starting at age 59.  Hepatitis C blood test.  Hepatitis B blood test.  Sexually transmitted disease (STD) testing.  Diabetes screening. This is done by checking your blood sugar (glucose) after you have not eaten for a while (fasting). You may have this done every 1-3 years.  Bone density scan. This is done to screen for osteoporosis. You may have this done starting at age 73.  Mammogram. This may be done every 1-2 years. Talk to your health care provider about how often you should have regular mammograms.  Talk with your health care provider about your test results, treatment options, and if necessary, the need for more tests. Vaccines Your health care provider may recommend certain vaccines, such as:  Influenza vaccine. This is recommended every year.  Tetanus, diphtheria, and acellular pertussis (Tdap, Td) vaccine. You may need a Td booster every 10 years.  Varicella vaccine. You may need this if you have not been vaccinated.  Zoster vaccine. You may need this after age 57.  Measles, mumps, and rubella (MMR) vaccine. You may need at least one dose of MMR if you were born in 1957 or later. You may also need a second dose.  Pneumococcal 13-valent conjugate (PCV13) vaccine. One dose is recommended after age 71.  Pneumococcal polysaccharide (PPSV23) vaccine. One dose is recommended  after age 66.  Meningococcal vaccine. You may need this if you have certain conditions.  Hepatitis A vaccine. You may need this if you have certain conditions or if you travel or work in places where you may be exposed to hepatitis A.  Hepatitis B vaccine. You may need this if you have certain conditions or if you travel or work in places where you may  be exposed to hepatitis B.  Haemophilus influenzae type b (Hib) vaccine. You may need this if you have certain conditions.  Talk to your health care provider about which screenings and vaccines you need and how often you need them. This information is not intended to replace advice given to you by your health care provider. Make sure you discuss any questions you have with your health care provider. Document Released: 02/16/2015 Document Revised: 10/10/2015 Document Reviewed: 11/21/2014 Elsevier Interactive Patient Education  2017 [employer].

## 2016-08-18 DIAGNOSIS — M9902 Segmental and somatic dysfunction of thoracic region: Secondary | ICD-10-CM | POA: Diagnosis not present

## 2016-08-18 DIAGNOSIS — M9903 Segmental and somatic dysfunction of lumbar region: Secondary | ICD-10-CM | POA: Diagnosis not present

## 2016-08-18 DIAGNOSIS — M9901 Segmental and somatic dysfunction of cervical region: Secondary | ICD-10-CM | POA: Diagnosis not present

## 2016-09-01 ENCOUNTER — Ambulatory Visit (INDEPENDENT_AMBULATORY_CARE_PROVIDER_SITE_OTHER): Payer: PPO | Admitting: Nurse Practitioner

## 2016-09-01 ENCOUNTER — Encounter: Payer: Self-pay | Admitting: Nurse Practitioner

## 2016-09-01 VITALS — BP 132/70 | HR 80 | Temp 97.3°F | Ht 60.0 in | Wt 149.0 lb

## 2016-09-01 DIAGNOSIS — Z0000 Encounter for general adult medical examination without abnormal findings: Secondary | ICD-10-CM | POA: Diagnosis not present

## 2016-09-01 DIAGNOSIS — Z01419 Encounter for gynecological examination (general) (routine) without abnormal findings: Secondary | ICD-10-CM

## 2016-09-01 LAB — URINALYSIS, COMPLETE
Bilirubin, UA: NEGATIVE
Glucose, UA: NEGATIVE
Ketones, UA: NEGATIVE
Leukocytes, UA: NEGATIVE
NITRITE UA: NEGATIVE
PH UA: 5.5 (ref 5.0–7.5)
Protein, UA: NEGATIVE
RBC UA: NEGATIVE
Specific Gravity, UA: 1.025 (ref 1.005–1.030)
UUROB: 0.2 mg/dL (ref 0.2–1.0)

## 2016-09-01 LAB — MICROSCOPIC EXAMINATION
Bacteria, UA: NONE SEEN
RBC MICROSCOPIC, UA: NONE SEEN /HPF (ref 0–?)
RENAL EPITHEL UA: NONE SEEN /HPF
WBC UA: NONE SEEN /HPF (ref 0–?)

## 2016-09-01 NOTE — Patient Instructions (Signed)

## 2016-09-01 NOTE — Progress Notes (Signed)
Subjective:    Patient ID: Krista Price, female    DOB: 03/11/1945, 71 y.o.   MRN: [id number]  HPI Patient comes in today for complete physical exam and PAP. Her only current medial problem is osteopenia in which she takes vitamin d and calcium daily. SHe is very active. SHe has no complaints today.   Review of Systems  Constitutional: Negative for activity change and appetite change.  HENT: Negative.   Eyes: Negative for pain.  Respiratory: Negative for shortness of breath.   Cardiovascular: Negative for chest pain, palpitations and leg swelling.  Gastrointestinal: Negative for abdominal pain.  Endocrine: Negative for polydipsia.  Genitourinary: Negative.   Skin: Negative for rash.  Neurological: Negative for dizziness, weakness and headaches.  Hematological: Does not bruise/bleed easily.  Psychiatric/Behavioral: Negative.   All other systems reviewed and are negative.      Objective:   Physical Exam  Constitutional: She is oriented to person, place, and time. She appears well-developed and well-nourished.  HENT:  Head: Normocephalic.  Right Ear: Hearing, tympanic membrane, external ear and ear canal normal.  Left Ear: Hearing, tympanic membrane, external ear and ear canal normal.  Nose: Nose normal.  Mouth/Throat: Uvula is midline and oropharynx is clear and moist.  Eyes: Pupils are equal, round, and reactive to light. Conjunctivae and EOM are normal.  Neck: Normal range of motion and full passive range of motion without pain. Neck supple. No JVD present. Carotid bruit is not present. No thyroid mass and no thyromegaly present.  Cardiovascular: Normal rate, normal heart sounds and intact distal pulses.   No murmur heard. Pulmonary/Chest: Effort normal and breath sounds normal. Right breast exhibits no inverted nipple, no mass, no nipple discharge, no skin change and no tenderness. Left breast exhibits no inverted nipple, no mass, no nipple discharge, no skin change and no  tenderness.  Abdominal: Soft. Bowel sounds are normal. She exhibits no mass. There is no tenderness.  Genitourinary: Vagina normal and uterus normal. No breast swelling, tenderness, discharge or bleeding. No vaginal discharge found.  Genitourinary Comments: bimanual exam-No adnexal masses or tenderness. Cervix parous and pink  Musculoskeletal: Normal range of motion.  Lymphadenopathy:    She has no cervical adenopathy.  Neurological: She is alert and oriented to person, place, and time.  Skin: Skin is warm and dry.  Psychiatric: She has a normal mood and affect. Her behavior is normal. Judgment and thought content normal.   BP 132/70   Pulse 80   Temp (!) 97.3 F (36.3 C) (Oral)   Ht 5' (1.524 m)   Wt 149 lb (67.6 kg)   BMI 29.10 kg/m        Assessment & Plan:.mmmpt   1. Annual physical exam - CMP14+EGFR - Lipid panel - CBC with Differential/Platelet - Thyroid Panel With TSH - Urinalysis, Complete  2. Gynecologic exam normal - Pap IG (Image Guided)    Labs pending Health maintenance reviewed Diet and exercise encouraged Continue all meds Follow up  In 1 year   [place], FNP

## 2016-09-02 LAB — LIPID PANEL
CHOL/HDL RATIO: 5 ratio — AB (ref 0.0–4.4)
CHOLESTEROL TOTAL: 245 mg/dL — AB (ref 100–199)
HDL: 49 mg/dL (ref >39)
LDL Calculated: 162 mg/dL — ABNORMAL HIGH (ref 0–99)
TRIGLYCERIDES: 168 mg/dL — AB (ref 0–149)
VLDL Cholesterol Cal: 34 mg/dL (ref 5–40)

## 2016-09-02 LAB — CMP14+EGFR
A/G RATIO: 1.9 (ref 1.2–2.2)
ALK PHOS: 91 IU/L (ref 39–117)
ALT: 35 IU/L — ABNORMAL HIGH (ref 0–32)
AST: 26 IU/L (ref 0–40)
Albumin: 4.6 g/dL (ref 3.5–4.8)
BUN/Creatinine Ratio: 21 (ref 12–28)
BUN: 14 mg/dL (ref 8–27)
Bilirubin Total: 0.4 mg/dL (ref 0.0–1.2)
CO2: 22 mmol/L (ref 20–29)
Calcium: 9.4 mg/dL (ref 8.7–10.3)
Chloride: 104 mmol/L (ref 96–106)
Creatinine, Ser: 0.67 mg/dL (ref 0.57–1.00)
GFR calc Af Amer: 102 mL/min/{1.73_m2} (ref >59)
GFR calc non Af Amer: 88 mL/min/{1.73_m2} (ref >59)
GLOBULIN, TOTAL: 2.4 g/dL (ref 1.5–4.5)
Glucose: 91 mg/dL (ref 65–99)
POTASSIUM: 4.4 mmol/L (ref 3.5–5.2)
SODIUM: 142 mmol/L (ref 134–144)
Total Protein: 7 g/dL (ref 6.0–8.5)

## 2016-09-02 LAB — PAP IG (IMAGE GUIDED): PAP Smear Comment: 0

## 2016-09-02 LAB — CBC WITH DIFFERENTIAL/PLATELET
BASOS ABS: 0 10*3/uL (ref 0.0–0.2)
BASOS: 1 %
EOS (ABSOLUTE): 0.1 10*3/uL (ref 0.0–0.4)
Eos: 1 %
HEMOGLOBIN: 13.6 g/dL (ref 11.1–15.9)
Hematocrit: 38.7 % (ref 34.0–46.6)
IMMATURE GRANS (ABS): 0 10*3/uL (ref 0.0–0.1)
Immature Granulocytes: 1 %
LYMPHS: 24 %
Lymphocytes Absolute: 1.3 10*3/uL (ref 0.7–3.1)
MCH: 32.3 pg (ref 26.6–33.0)
MCHC: 35.1 g/dL (ref 31.5–35.7)
MCV: 92 fL (ref 79–97)
MONOCYTES: 6 %
Monocytes Absolute: 0.3 10*3/uL (ref 0.1–0.9)
NEUTROS ABS: 3.6 10*3/uL (ref 1.4–7.0)
Neutrophils: 67 %
Platelets: 233 10*3/uL (ref 150–379)
RBC: 4.21 x10E6/uL (ref 3.77–5.28)
RDW: 14.2 % (ref 12.3–15.4)
WBC: 5.2 10*3/uL (ref 3.4–10.8)

## 2016-09-02 LAB — THYROID PANEL WITH TSH
FREE THYROXINE INDEX: 1.7 (ref 1.2–4.9)
T3 UPTAKE RATIO: 24 % (ref 24–39)
T4, Total: 6.9 ug/dL (ref 4.5–12.0)
TSH: 2.87 u[IU]/mL (ref 0.450–4.500)

## 2016-09-15 DIAGNOSIS — M9903 Segmental and somatic dysfunction of lumbar region: Secondary | ICD-10-CM | POA: Diagnosis not present

## 2016-09-15 DIAGNOSIS — M9902 Segmental and somatic dysfunction of thoracic region: Secondary | ICD-10-CM | POA: Diagnosis not present

## 2016-09-15 DIAGNOSIS — M9901 Segmental and somatic dysfunction of cervical region: Secondary | ICD-10-CM | POA: Diagnosis not present

## 2016-10-02 ENCOUNTER — Encounter: Payer: Self-pay | Admitting: Nurse Practitioner

## 2016-10-02 ENCOUNTER — Ambulatory Visit (INDEPENDENT_AMBULATORY_CARE_PROVIDER_SITE_OTHER): Payer: PPO | Admitting: Nurse Practitioner

## 2016-10-02 VITALS — BP 119/62 | HR 80 | Temp 96.9°F | Ht 60.0 in | Wt 150.0 lb

## 2016-10-02 DIAGNOSIS — D229 Melanocytic nevi, unspecified: Secondary | ICD-10-CM

## 2016-10-02 NOTE — Progress Notes (Signed)
   Subjective:    Patient ID: Krista Price, female    DOB: 11/12/1945, 71 y.o.   MRN: [id number]  HPI  Patient comes in today with mole just below left mammary fold. She says that it gets hung on her clothes which causes it to bleed. Wants it removed today.   Review of Systems  Constitutional: Negative.   Respiratory: Negative.   Cardiovascular: Negative.   Skin:       Mole just below left mammary fold near midline  All other systems reviewed and are negative.      Objective:   Physical Exam  Constitutional: She is oriented to person, place, and time. She appears well-developed and well-nourished.  Cardiovascular: Normal rate and regular rhythm.   Pulmonary/Chest: Effort normal and breath sounds normal.  Neurological: She is alert and oriented to person, place, and time.  Skin: Skin is warm.  2cm raised flesh colored mole below left mammary fold coser to midline.   BP 119/62   Pulse 80   Temp (!) 96.9 F (36.1 C) (Oral)   Ht 5' (1.524 m)   Wt 150 lb (68 kg)   BMI 29.29 kg/m    Procedure: betadine prep         Lidocaine 2% with epi 30ml local         Shaved with curved blade          Silver nitrate sticks for cauterization         Antibiotic ointment         bandaid * patient tolerated well      Assessment & Plan:   1. Benign mole    mpole removal Keep clean and dry Watch for signs of infection No need to return unless becomes infected Keep follow up appointments with PCP  Mary-Margaret Hassell Done, FNP

## 2016-10-02 NOTE — Patient Instructions (Signed)
Excision of Skin Lesions, Care After Refer to this sheet in the next few weeks. These instructions provide you with information about caring for yourself after your procedure. Your health care provider may also give you more specific instructions. Your treatment has been planned according to current medical practices, but problems sometimes occur. Call your health care provider if you have any problems or questions after your procedure. What can I expect after the procedure? After your procedure, it is common to have pain or discomfort at the excision site. Follow these instructions at home:  Take over-the-counter and prescription medicines only as told by your health care provider.  Follow instructions from your health care provider about: ? How to take care of your excision site. You should keep the site clean, dry, and protected for at least 48 hours. ? When and how you should change your bandage (dressing). ? When you should remove your dressing. ? Removing whatever was used to close your excision site.  Check the excision area every day for signs of infection. Watch for: ? Redness, swelling, or pain. ? Fluid, blood, or pus.  For bleeding, apply gentle but firm pressure to the area using a folded towel for 20 minutes.  Avoid high-impact exercise and activities until the stitches (sutures) are removed or the area heals.  Follow instructions from your health care provider about how to minimize scarring. Avoid sun exposure until the area has healed. Scarring should lessen over time.  Keep all follow-up visits as told by your health care provider. This is important. Contact a health care provider if:  You have a fever.  You have redness, swelling, or pain at the excision site.  You have fluid, blood, or pus coming from the excision site.  You have ongoing bleeding at the excision site.  You have pain that does not improve in 2-3 days after your procedure.  You notice skin  irregularities or changes in sensation. This information is not intended to replace advice given to you by your health care provider. Make sure you discuss any questions you have with your health care provider. Document Released: 06/06/2014 Document Revised: 06/28/2015 Document Reviewed: 03/08/2014 Elsevier Interactive Patient Education  2018 [employer]  

## 2016-10-10 ENCOUNTER — Telehealth: Payer: Self-pay | Admitting: Family

## 2016-10-13 DIAGNOSIS — M9903 Segmental and somatic dysfunction of lumbar region: Secondary | ICD-10-CM | POA: Diagnosis not present

## 2016-10-13 DIAGNOSIS — M9901 Segmental and somatic dysfunction of cervical region: Secondary | ICD-10-CM | POA: Diagnosis not present

## 2016-10-13 DIAGNOSIS — M9902 Segmental and somatic dysfunction of thoracic region: Secondary | ICD-10-CM | POA: Diagnosis not present

## 2016-11-10 DIAGNOSIS — M9903 Segmental and somatic dysfunction of lumbar region: Secondary | ICD-10-CM | POA: Diagnosis not present

## 2016-11-10 DIAGNOSIS — M9902 Segmental and somatic dysfunction of thoracic region: Secondary | ICD-10-CM | POA: Diagnosis not present

## 2016-11-10 DIAGNOSIS — M9901 Segmental and somatic dysfunction of cervical region: Secondary | ICD-10-CM | POA: Diagnosis not present

## 2016-12-08 DIAGNOSIS — M9902 Segmental and somatic dysfunction of thoracic region: Secondary | ICD-10-CM | POA: Diagnosis not present

## 2016-12-08 DIAGNOSIS — M9903 Segmental and somatic dysfunction of lumbar region: Secondary | ICD-10-CM | POA: Diagnosis not present

## 2016-12-08 DIAGNOSIS — M9901 Segmental and somatic dysfunction of cervical region: Secondary | ICD-10-CM | POA: Diagnosis not present

## 2017-01-19 DIAGNOSIS — Z803 Family history of malignant neoplasm of breast: Secondary | ICD-10-CM | POA: Diagnosis not present

## 2017-01-19 DIAGNOSIS — Z1231 Encounter for screening mammogram for malignant neoplasm of breast: Secondary | ICD-10-CM | POA: Diagnosis not present

## 2017-01-19 DIAGNOSIS — M9901 Segmental and somatic dysfunction of cervical region: Secondary | ICD-10-CM | POA: Diagnosis not present

## 2017-01-19 DIAGNOSIS — M9902 Segmental and somatic dysfunction of thoracic region: Secondary | ICD-10-CM | POA: Diagnosis not present

## 2017-01-19 DIAGNOSIS — M9903 Segmental and somatic dysfunction of lumbar region: Secondary | ICD-10-CM | POA: Diagnosis not present

## 2017-01-19 LAB — HM MAMMOGRAPHY

## 2017-01-30 ENCOUNTER — Encounter: Payer: Self-pay | Admitting: *Deleted

## 2017-02-16 DIAGNOSIS — M9903 Segmental and somatic dysfunction of lumbar region: Secondary | ICD-10-CM | POA: Diagnosis not present

## 2017-02-16 DIAGNOSIS — M9902 Segmental and somatic dysfunction of thoracic region: Secondary | ICD-10-CM | POA: Diagnosis not present

## 2017-02-16 DIAGNOSIS — M9901 Segmental and somatic dysfunction of cervical region: Secondary | ICD-10-CM | POA: Diagnosis not present

## 2017-03-16 DIAGNOSIS — M9901 Segmental and somatic dysfunction of cervical region: Secondary | ICD-10-CM | POA: Diagnosis not present

## 2017-03-16 DIAGNOSIS — M9902 Segmental and somatic dysfunction of thoracic region: Secondary | ICD-10-CM | POA: Diagnosis not present

## 2017-03-16 DIAGNOSIS — M9903 Segmental and somatic dysfunction of lumbar region: Secondary | ICD-10-CM | POA: Diagnosis not present

## 2017-05-18 DIAGNOSIS — M9903 Segmental and somatic dysfunction of lumbar region: Secondary | ICD-10-CM | POA: Diagnosis not present

## 2017-05-18 DIAGNOSIS — M9901 Segmental and somatic dysfunction of cervical region: Secondary | ICD-10-CM | POA: Diagnosis not present

## 2017-05-18 DIAGNOSIS — M9902 Segmental and somatic dysfunction of thoracic region: Secondary | ICD-10-CM | POA: Diagnosis not present

## 2017-06-15 DIAGNOSIS — M9902 Segmental and somatic dysfunction of thoracic region: Secondary | ICD-10-CM | POA: Diagnosis not present

## 2017-06-15 DIAGNOSIS — M9903 Segmental and somatic dysfunction of lumbar region: Secondary | ICD-10-CM | POA: Diagnosis not present

## 2017-06-15 DIAGNOSIS — M9901 Segmental and somatic dysfunction of cervical region: Secondary | ICD-10-CM | POA: Diagnosis not present

## 2017-07-13 DIAGNOSIS — M9901 Segmental and somatic dysfunction of cervical region: Secondary | ICD-10-CM | POA: Diagnosis not present

## 2017-07-13 DIAGNOSIS — M9902 Segmental and somatic dysfunction of thoracic region: Secondary | ICD-10-CM | POA: Diagnosis not present

## 2017-07-13 DIAGNOSIS — M9903 Segmental and somatic dysfunction of lumbar region: Secondary | ICD-10-CM | POA: Diagnosis not present

## 2017-08-10 DIAGNOSIS — M9903 Segmental and somatic dysfunction of lumbar region: Secondary | ICD-10-CM | POA: Diagnosis not present

## 2017-08-10 DIAGNOSIS — M9901 Segmental and somatic dysfunction of cervical region: Secondary | ICD-10-CM | POA: Diagnosis not present

## 2017-08-10 DIAGNOSIS — M9902 Segmental and somatic dysfunction of thoracic region: Secondary | ICD-10-CM | POA: Diagnosis not present

## 2017-09-07 DIAGNOSIS — M9903 Segmental and somatic dysfunction of lumbar region: Secondary | ICD-10-CM | POA: Diagnosis not present

## 2017-09-07 DIAGNOSIS — M9902 Segmental and somatic dysfunction of thoracic region: Secondary | ICD-10-CM | POA: Diagnosis not present

## 2017-09-07 DIAGNOSIS — M9901 Segmental and somatic dysfunction of cervical region: Secondary | ICD-10-CM | POA: Diagnosis not present

## 2017-10-12 DIAGNOSIS — M9901 Segmental and somatic dysfunction of cervical region: Secondary | ICD-10-CM | POA: Diagnosis not present

## 2017-10-12 DIAGNOSIS — M9903 Segmental and somatic dysfunction of lumbar region: Secondary | ICD-10-CM | POA: Diagnosis not present

## 2017-10-12 DIAGNOSIS — M9902 Segmental and somatic dysfunction of thoracic region: Secondary | ICD-10-CM | POA: Diagnosis not present

## 2017-10-16 DIAGNOSIS — H524 Presbyopia: Secondary | ICD-10-CM | POA: Diagnosis not present

## 2017-11-09 DIAGNOSIS — M9903 Segmental and somatic dysfunction of lumbar region: Secondary | ICD-10-CM | POA: Diagnosis not present

## 2017-11-09 DIAGNOSIS — M9901 Segmental and somatic dysfunction of cervical region: Secondary | ICD-10-CM | POA: Diagnosis not present

## 2017-11-09 DIAGNOSIS — M9902 Segmental and somatic dysfunction of thoracic region: Secondary | ICD-10-CM | POA: Diagnosis not present

## 2017-12-07 DIAGNOSIS — M9901 Segmental and somatic dysfunction of cervical region: Secondary | ICD-10-CM | POA: Diagnosis not present

## 2017-12-07 DIAGNOSIS — M9902 Segmental and somatic dysfunction of thoracic region: Secondary | ICD-10-CM | POA: Diagnosis not present

## 2017-12-07 DIAGNOSIS — M9903 Segmental and somatic dysfunction of lumbar region: Secondary | ICD-10-CM | POA: Diagnosis not present

## 2018-01-04 DIAGNOSIS — M9903 Segmental and somatic dysfunction of lumbar region: Secondary | ICD-10-CM | POA: Diagnosis not present

## 2018-01-04 DIAGNOSIS — M9901 Segmental and somatic dysfunction of cervical region: Secondary | ICD-10-CM | POA: Diagnosis not present

## 2018-01-04 DIAGNOSIS — M9902 Segmental and somatic dysfunction of thoracic region: Secondary | ICD-10-CM | POA: Diagnosis not present

## 2018-01-25 DIAGNOSIS — Z1231 Encounter for screening mammogram for malignant neoplasm of breast: Secondary | ICD-10-CM | POA: Diagnosis not present

## 2018-01-25 DIAGNOSIS — Z803 Family history of malignant neoplasm of breast: Secondary | ICD-10-CM | POA: Diagnosis not present

## 2018-01-25 LAB — HM MAMMOGRAPHY

## 2018-02-08 DIAGNOSIS — M9902 Segmental and somatic dysfunction of thoracic region: Secondary | ICD-10-CM | POA: Diagnosis not present

## 2018-02-08 DIAGNOSIS — M9901 Segmental and somatic dysfunction of cervical region: Secondary | ICD-10-CM | POA: Diagnosis not present

## 2018-02-08 DIAGNOSIS — M9903 Segmental and somatic dysfunction of lumbar region: Secondary | ICD-10-CM | POA: Diagnosis not present

## 2018-03-08 DIAGNOSIS — M9902 Segmental and somatic dysfunction of thoracic region: Secondary | ICD-10-CM | POA: Diagnosis not present

## 2018-03-08 DIAGNOSIS — M9901 Segmental and somatic dysfunction of cervical region: Secondary | ICD-10-CM | POA: Diagnosis not present

## 2018-03-08 DIAGNOSIS — M9903 Segmental and somatic dysfunction of lumbar region: Secondary | ICD-10-CM | POA: Diagnosis not present

## 2018-03-30 ENCOUNTER — Telehealth: Payer: Self-pay | Admitting: Family

## 2018-03-30 NOTE — Telephone Encounter (Signed)
PT is scheduled to come in on 3/6 for her AWV

## 2018-04-09 ENCOUNTER — Other Ambulatory Visit: Payer: Self-pay | Admitting: Family

## 2018-04-09 ENCOUNTER — Ambulatory Visit (INDEPENDENT_AMBULATORY_CARE_PROVIDER_SITE_OTHER): Payer: PPO | Admitting: *Deleted

## 2018-04-09 ENCOUNTER — Ambulatory Visit (INDEPENDENT_AMBULATORY_CARE_PROVIDER_SITE_OTHER): Payer: PPO

## 2018-04-09 ENCOUNTER — Encounter: Payer: Self-pay | Admitting: *Deleted

## 2018-04-09 VITALS — BP 123/67 | HR 76 | Ht 60.0 in | Wt 150.0 lb

## 2018-04-09 DIAGNOSIS — Z780 Asymptomatic menopausal state: Secondary | ICD-10-CM

## 2018-04-09 DIAGNOSIS — M8588 Other specified disorders of bone density and structure, other site: Secondary | ICD-10-CM | POA: Diagnosis not present

## 2018-04-09 DIAGNOSIS — Z1212 Encounter for screening for malignant neoplasm of rectum: Secondary | ICD-10-CM

## 2018-04-09 DIAGNOSIS — Z0000 Encounter for general adult medical examination without abnormal findings: Secondary | ICD-10-CM

## 2018-04-09 DIAGNOSIS — Z1211 Encounter for screening for malignant neoplasm of colon: Secondary | ICD-10-CM

## 2018-04-09 NOTE — Patient Instructions (Signed)
Keep up the great work on your healthy diet and exercise habits!   Please review the information given on Advance Directives.  If you complete the paperwork, please bring a copy to our office to be filed in your medical record.    Cologuard  Your provider has prescribed Cologuard, an easy-to-use, noninvasive test for colon cancer screening, based on the latest advances in stool DNA science.   Here's what will happen next:  1. You may receive a call or email from [facility] to confirm your mailing address and insurance information 2. Your kit will be shipped directly to you 3. You collect your stool sample in the privacy of your own home. Please follow the instructions that come with the kit 4. You return the kit via [facility] shipping or pick-up, in the same box it arrived in 5. You should receive a call with the results once they are available. If you do not receive a call, please contact our office at [phone]  Insurance Coverage  Cologuard is covered by Medicare and most major insurers Cologuard is covered by Medicare and Medicare Advantage with no co-pay or deductible for eligible patients ages 66-85. Nationwide, more than 94% of Cologuard patients have no out-of-pocket cost for screening. . Based on the [place] should be covered by most private insurers with no co-pay or deductible for eligible patients (ages 31-75; at average risk for colon cancer; without symptoms). Currently, ~74% of Cologuard patients 45-49 have had no out-of-pocket cost for screening.  . Many national and regional payers have begun paying for CRC screening at 70. [employer] continues to work with payers to expand coverage and access for patients ages 71-49.   Only your healthcare insurance provider can confirm how Cologuard will be covered for you. If you have questions about coverage, you can contact your insurance company directly or ask the specialists at [employer]  to do that for you. A Customer Support Specialist can be reached at [phone].    Patient Support Screening for colon cancer is very important to your good health, so if you have any questions at all, please call [facility] Customer Support Specialists at [phone]. They are available 24 hours a day, 6 days a week. An instructional video is available to view online at [email or web address]   *Information and graphics obtained from [email or web address]   Please follow up with Krista Price, Krista Price on 04/26/2018 at 8:10 am.   Thank you for coming in for your Annual Wellness Visit today!!   Preventive Care 65 Years and Older, Female Preventive care refers to lifestyle choices and visits with your health care provider that can promote health and wellness. What does preventive care include?  A yearly physical exam. This is also called an annual well check.  Dental exams once or twice a year.  Routine eye exams. Ask your health care provider how often you should have your eyes checked.  Personal lifestyle choices, including: ? Daily care of your teeth and gums. ? Regular physical activity. ? Eating a healthy diet. ? Avoiding tobacco and drug use. ? Limiting alcohol use. ? Practicing safe sex. ? Taking low-dose aspirin every day. ? Taking vitamin and mineral supplements as recommended by your health care provider./ What happens during an annual well check? The services and screenings done by your health care provider during your annual well check will depend on your age, overall health, lifestyle risk factors, and family history of disease. Counseling Your health care  provider may ask you questions about your:  Alcohol use.  Tobacco use.  Drug use.  Emotional well-being.  Home and relationship well-being.  Sexual activity.  Eating habits.  History of falls.  Memory and ability to understand (cognition).  Work and work Statistician.  Reproductive  health.  Screening You may have the following tests or measurements:  Height, weight, and BMI.  Blood pressure.  Lipid and cholesterol levels. These may be checked every 5 years, or more frequently if you are over 31 years old.  Skin check.  Lung cancer screening. You may have this screening every year starting at age 82 if you have a 30-pack-year history of smoking and currently smoke or have quit within the past 15 years.  Colorectal cancer screening. All adults should have this screening starting at age 44 and continuing until age 58. You will have tests every 1-10 years, depending on your results and the type of screening test. People at increased risk should start screening at an earlier age. Screening tests may include: ? Guaiac-based fecal occult blood testing. ? Fecal immunochemical test (FIT). ? Stool DNA test. ? Virtual colonoscopy. ? Sigmoidoscopy. During this test, a flexible tube with a tiny camera (sigmoidoscope) is used to examine your rectum and lower colon. The sigmoidoscope is inserted through your anus into your rectum and lower colon. ? Colonoscopy. During this test, a long, thin, flexible tube with a tiny camera (colonoscope) is used to examine your entire colon and rectum.  Hepatitis C blood test.  Hepatitis B blood test.  Sexually transmitted disease (STD) testing.  Diabetes screening. This is done by checking your blood sugar (glucose) after you have not eaten for a while (fasting). You may have this done every 1-3 years.  Bone density scan. This is done to screen for osteoporosis. You may have this done starting at age 1.  Mammogram. This may be done every 1-2 years. Talk to your health care provider about how often you should have regular mammograms. Talk with your health care provider about your test results, treatment options, and if necessary, the need for more tests. Vaccines Your health care provider may recommend certain vaccines, such  as:  Influenza vaccine. This is recommended every year.  Tetanus, diphtheria, and acellular pertussis (Tdap, Td) vaccine. You may need a Td booster every 10 years.  Varicella vaccine. You may need this if you have not been vaccinated.  Zoster vaccine. You may need this after age 78.  Measles, mumps, and rubella (MMR) vaccine. You may need at least one dose of MMR if you were born in 1957 or later. You may also need a second dose.  Pneumococcal 13-valent conjugate (PCV13) vaccine. One dose is recommended after age 32.  Pneumococcal polysaccharide (PPSV23) vaccine. One dose is recommended after age 26.  Meningococcal vaccine. You may need this if you have certain conditions.  Hepatitis A vaccine. You may need this if you have certain conditions or if you travel or work in places where you may be exposed to hepatitis A.  Hepatitis B vaccine. You may need this if you have certain conditions or if you travel or work in places where you may be exposed to hepatitis B.  Haemophilus influenzae type b (Hib) vaccine. You may need this if you have certain conditions. Talk to your health care provider about which screenings and vaccines you need and how often you need them. This information is not intended to replace advice given to you by your health care  provider. Make sure you discuss any questions you have with your health care provider. Document Released: 02/16/2015 Document Revised: 03/12/2017 Document Reviewed: 11/21/2014 Elsevier Interactive Patient Education  2019 [employer].

## 2018-04-09 NOTE — Progress Notes (Addendum)
Subjective:   Krista Price is a 73 y.o. female who presents for a subsequent Medicare Annual Wellness Visit.  Krista Price is retired from [employer].  She has 2 children and 2 grandchildren.  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  She is very active doing exercise classes for 1-2 hours 5-6 days per week.  Krista Price lives at home with her husband who has cancer.  He is not taking cancer treatments at this time, as patient states the last round was not effective.  He is trying to stay active and is able to drive himself places.  She states he gets fatigued easily.    Patient Care Team: Sharion Balloon, FNP as PCP - General (Family Medicine)  Hospitalizations, surgeries, and ER visits in previous 12 months No hospitalizations, ER visits, or surgeries this past year.   Review of Systems    Patient reports that her overall health is unchanged compared to last year.  Cardiac Risk Factors include: advanced age (>6men, >32 women)  All other systems negative       Current Medications (verified) Outpatient Encounter Medications as of 04/09/2018  Medication Sig  . Aloe LIQD Take by mouth daily.  . Calcium Carbonate-Vit D-Min (CALCIUM 600 + MINERALS) 600-200 MG-UNIT TABS Take 1 tablet by mouth daily.  . Multiple Vitamins-Minerals (MULTIPLE VITAMINS/WOMENS PO) Take 1 tablet by mouth daily.  . Vitamin D, Cholecalciferol, 1000 UNITS TABS Take 1,000 Units by mouth daily.  Marland Kitchen glucosamine-chondroitin 500-400 MG tablet Take 1 tablet by mouth 2 (two) times daily.   No facility-administered encounter medications on file as of 04/09/2018.     Allergies (verified) Patient has no known allergies.   History: No past medical history on file. No past surgical history on file. Family History  Problem Relation Age of Onset  . Cancer Mother        breast  . Cancer Father        Lung  . Heart disease Father   . Diabetes Brother   . Hyperlipidemia Daughter     Social History   Socioeconomic History  . Marital status: Married    Spouse name: Not on file  . Number of children: 2  . Years of education: Not on file  . Highest education level: High school graduate  Occupational History  . Occupation: Retired    Comment: [employer]   Social Needs  . Financial resource strain: Not hard at all  . Food insecurity:    Worry: Never true    Inability: Never true  . Transportation needs:    Medical: No    Non-medical: No  Tobacco Use  . Smoking status: Never Smoker  . Smokeless tobacco: Never Used  Substance and Sexual Activity  . Alcohol use: No    Alcohol/week: 0.0 standard drinks  . Drug use: No  . Sexual activity: Not on file  Lifestyle  . Physical activity:    Days per week: 5 days    Minutes per session: 120 min  . Stress: Only a little  Relationships  . Social connections:    Talks on phone: More than three times a week    Gets together: More than three times a week    Attends religious service: More than 4 times per year    Active member of club or organization: Yes    Attends meetings of clubs or organizations: More than 4 times per year    Relationship  status: Married  Other Topics Concern  . Not on file  Social History Narrative  . Not on file     Clinical Intake:     Pain Score: 0-No pain                  Activities of Daily Living In your present state of health, do you have any difficulty performing the following activities: 04/09/2018  Hearing? N  Vision? N  Difficulty concentrating or making decisions? N  Walking or climbing stairs? N  Dressing or bathing? N  Doing errands, shopping? N  Preparing Food and eating ? N  Using the Toilet? N  In the past six months, have you accidently leaked urine? Y  Comment With coughing or sneezing  Do you have problems with loss of bowel control? N  Managing your Medications? N  Managing your Finances? N  Housekeeping or managing your  Housekeeping? N  Some recent data might be hidden     Exercise Current Exercise Habits: Home exercise routine;Structured exercise class, Type of exercise: strength training/weights;walking;yoga;Other - see comments(cycling classes, zumba), Time (Minutes): 60, Frequency (Times/Week): 6, Weekly Exercise (Minutes/Week): 360, Intensity: Moderate  Diet Consumes 3 meals a day and 0 snacks a day.  The patient feels that she mostly follow a Low fat diet.  Diet History Patient states she eats a healthy diet of mostly non-starchy vegetables, fruits, lean proteins, and whole grains.  She has access to all the food she needs.    Depression Screen PHQ 2/9 Scores 04/09/2018 10/02/2016 09/01/2016 07/21/2016 11/23/2014  PHQ - 2 Score 0 0 0 0 0     Fall Risk Fall Risk  04/09/2018 10/02/2016 09/01/2016 07/21/2016 11/23/2014  Falls in the past year? 0 No No No No     Objective:    Today's Vitals   04/09/18 0834  BP: 123/67  Pulse: 76  Weight: 150 lb (68 kg)  Height: 5' (1.524 m)  PainSc: 0-No pain   Body mass index is 29.29 kg/m.  Advanced Directives 04/09/2018 07/21/2016  Does Patient Have a Medical Advance Directive? No No  Would patient like information on creating a medical advance directive? Yes (MAU/Ambulatory/Procedural Areas - Information given) Yes (ED - Information included in AVS)    Hearing/Vision  No hearing or vision deficits noted during visit. Patient goes for eye exams yearly with Dr. Sabra Heck in [place], [place].  Cognitive Function: MMSE - Mini Mental State Exam 04/09/2018 07/21/2016  Orientation to time 5 5  Orientation to Place 5 5  Registration 3 3  Attention/ Calculation 5 5  Recall 3 3  Language- name 2 objects 2 2  Language- repeat 1 1  Language- follow 3 step command 3 3  Language- read & follow direction 1 1  Write a sentence 1 1  Copy design 1 1  Total score 30 30           Immunizations and Health Maintenance  There is no immunization history on file for  this patient. Health Maintenance Due  Topic Date Due  . Hepatitis C Screening  [birth year]  . COLONOSCOPY  04/13/1994  . DEXA SCAN  12/06/2016   Dexa scan completed today Cologuard ordered today Recommend Hepatitis C screening at visit with Evelina Dun, FNP on 04/26/2018  Health Maintenance  Topic Date Due  . Hepatitis C Screening  11/22/1945  . COLONOSCOPY  04/13/1994  . DEXA SCAN  12/06/2016  . INFLUENZA VACCINE  10/03/2018 (Originally 09/03/2017)  . TETANUS/TDAP  04/09/2019 (  Originally 04/13/1963)  . PNA vac Low Risk Adult (1 of 2 - PCV13) 04/09/2019 (Originally 04/12/2009)  . MAMMOGRAM  01/26/2020        Assessment:   This is a routine wellness examination for [place].    Plan:    Goals    . Patient Stated (pt-stated)     Continue healthy exercise and diet regimen.         Health Maintenance & Additional Screening Recommendations  Pneumococcal vaccine - declined  Influenza vaccine - declined  Td vaccine - declined  Bone densitometry screening Colorectal cancer screening Advanced directives: has NO advanced directive  - add't info requested. Referral to SW: no  Lung: Low Dose CT Chest recommended if Age 60-80 years, 30 pack-year currently smoking OR have quit w/in 15years. Patient does not qualify. Hepatitis C Screening recommended: yes   Today's Orders Orders Placed This Encounter  Procedures  . DG [facility] DEXA    Standing Status:   Future    Number of Occurrences:   1    Standing Expiration Date:   06/09/2019    Order Specific Question:   Reason for Exam (SYMPTOM  OR DIAGNOSIS REQUIRED)    Answer:   post menopausal  . Cologuard    Keep f/u with Sharion Balloon, FNP and any other specialty appointments you may have Continue current medications Move carefully to avoid falls.  Aim for at least 150 minutes of moderate activity a week.  Read or work on puzzles daily Stay connected with friends and family  I have personally reviewed and noted the following  in the patient's chart:   . Medical and social history . Use of alcohol, tobacco or illicit drugs  . Current medications and supplements . Functional ability and status . Nutritional status . Physical activity . Advanced directives . List of other physicians . Hospitalizations, surgeries, and ER visits in previous 12 months . Vitals . Screenings to include cognitive, depression, and falls . Referrals and appointments  In addition, I have reviewed and discussed with patient certain preventive protocols, quality metrics, and best practice recommendations. A written personalized care plan for preventive services as well as general preventive health recommendations were provided to patient.     Nolberto Hanlon, RN  04/09/2018  I have reviewed and agree with the above AWV documentation.   Evelina Dun, FNP

## 2018-04-26 ENCOUNTER — Encounter: Payer: Self-pay | Admitting: Family

## 2018-04-26 ENCOUNTER — Ambulatory Visit (INDEPENDENT_AMBULATORY_CARE_PROVIDER_SITE_OTHER): Payer: PPO | Admitting: Family

## 2018-04-26 ENCOUNTER — Other Ambulatory Visit: Payer: Self-pay

## 2018-04-26 VITALS — BP 121/68 | HR 68 | Temp 97.2°F | Ht 60.0 in | Wt 149.4 lb

## 2018-04-26 DIAGNOSIS — M8580 Other specified disorders of bone density and structure, unspecified site: Secondary | ICD-10-CM

## 2018-04-26 DIAGNOSIS — E663 Overweight: Secondary | ICD-10-CM | POA: Diagnosis not present

## 2018-04-26 DIAGNOSIS — E559 Vitamin D deficiency, unspecified: Secondary | ICD-10-CM | POA: Diagnosis not present

## 2018-04-26 DIAGNOSIS — Z0000 Encounter for general adult medical examination without abnormal findings: Secondary | ICD-10-CM | POA: Diagnosis not present

## 2018-04-26 DIAGNOSIS — Z0001 Encounter for general adult medical examination with abnormal findings: Secondary | ICD-10-CM

## 2018-04-26 NOTE — Progress Notes (Signed)
   Subjective:    Patient ID: Krista Price, female    DOB: 1945/07/29, 73 y.o.   MRN: [id number]  HPI PT presents to the office today for CPE without pap. She is currently taking calcium and vit D for osteopenia. Pt denies any headache, palpitations, SOB, or edema at this time.     Review of Systems  All other systems reviewed and are negative.      Objective:   Physical Exam Vitals signs reviewed.  Constitutional:      General: She is not in acute distress.    Appearance: She is well-developed.  HENT:     Head: Normocephalic and atraumatic.     Right Ear: Tympanic membrane normal.     Left Ear: Tympanic membrane normal.  Eyes:     Pupils: Pupils are equal, round, and reactive to light.  Neck:     Musculoskeletal: Normal range of motion and neck supple.     Thyroid: No thyromegaly.  Cardiovascular:     Rate and Rhythm: Normal rate and regular rhythm.     Heart sounds: Normal heart sounds. No murmur.  Pulmonary:     Effort: Pulmonary effort is normal. No respiratory distress.     Breath sounds: Normal breath sounds. No wheezing.  Abdominal:     General: Bowel sounds are normal. There is no distension.     Palpations: Abdomen is soft.     Tenderness: There is no abdominal tenderness.  Musculoskeletal: Normal range of motion.        General: No tenderness.  Skin:    General: Skin is warm and dry.  Neurological:     Mental Status: She is alert and oriented to person, place, and time.     Cranial Nerves: No cranial nerve deficit.     Deep Tendon Reflexes: Reflexes are normal and symmetric.  Psychiatric:        Behavior: Behavior normal.        Thought Content: Thought content normal.        Judgment: Judgment normal.     BP 121/68   Pulse 68   Temp (!) 97.2 F (36.2 C) (Oral)   Ht 5' (1.524 m)   Wt 149 lb 6.4 oz (67.8 kg)   BMI 29.18 kg/m      Assessment & Plan:  Krista Price comes in today with chief complaint of Annual Exam   Diagnosis and orders  addressed:  1. Annual physical exam - CMP14+EGFR - CBC with Differential/Platelet - Lipid panel - TSH - VITAMIN D 25 Hydroxy (Vit-D Deficiency, Fractures)  2. Overweight (BMI 25.0-29.9) - CMP14+EGFR - CBC with Differential/Platelet  3. Osteopenia, unspecified location - CMP14+EGFR - CBC with Differential/Platelet - VITAMIN D 25 Hydroxy (Vit-D Deficiency, Fractures)  4. Vitamin D deficiency - CMP14+EGFR - CBC with Differential/Platelet - VITAMIN D 25 Hydroxy (Vit-D Deficiency, Fractures)   Labs pending Health Maintenance reviewed Diet and exercise encouraged  Follow up plan: 1 year    Evelina Dun, FNP

## 2018-04-26 NOTE — Patient Instructions (Signed)
Health Maintenance After Age 74 After age 74, you are at a higher risk for certain long-term diseases and infections as well as injuries from falls. Falls are a major cause of broken bones and head injuries in people who are older than age 74. Getting regular preventive care can help to keep you healthy and well. Preventive care includes getting regular testing and making lifestyle changes as recommended by your health care provider. Talk with your health care provider about:  Which screenings and tests you should have. A screening is a test that checks for a disease when you have no symptoms.  A diet and exercise plan that is right for you. What should I know about screenings and tests to prevent falls? Screening and testing are the best ways to find a health problem early. Early diagnosis and treatment give you the best chance of managing medical conditions that are common after age 74. Certain conditions and lifestyle choices may make you more likely to have a fall. Your health care provider may recommend:  Regular vision checks. Poor vision and conditions such as cataracts can make you more likely to have a fall. If you wear glasses, make sure to get your prescription updated if your vision changes.  Medicine review. Work with your health care provider to regularly review all of the medicines you are taking, including over-the-counter medicines. Ask your health care provider about any side effects that may make you more likely to have a fall. Tell your health care provider if any medicines that you take make you feel dizzy or sleepy.  Osteoporosis screening. Osteoporosis is a condition that causes the bones to get weaker. This can make the bones weak and cause them to break more easily.  Blood pressure screening. Blood pressure changes and medicines to control blood pressure can make you feel dizzy.  Strength and balance checks. Your health care provider may recommend certain tests to check your  strength and balance while standing, walking, or changing positions.  Foot health exam. Foot pain and numbness, as well as not wearing proper footwear, can make you more likely to have a fall.  Depression screening. You may be more likely to have a fall if you have a fear of falling, feel emotionally low, or feel unable to do activities that you used to do.  Alcohol use screening. Using too much alcohol can affect your balance and may make you more likely to have a fall. What actions can I take to lower my risk of falls? General instructions  Talk with your health care provider about your risks for falling. Tell your health care provider if: ? You fall. Be sure to tell your health care provider about all falls, even ones that seem minor. ? You feel dizzy, sleepy, or off-balance.  Take over-the-counter and prescription medicines only as told by your health care provider. These include any supplements.  Eat a healthy diet and maintain a healthy weight. A healthy diet includes low-fat dairy products, low-fat (lean) meats, and fiber from whole grains, beans, and lots of fruits and vegetables. Home safety  Remove any tripping hazards, such as rugs, cords, and clutter.  Install safety equipment such as grab bars in bathrooms and safety rails on stairs.  Keep rooms and walkways well-lit. Activity   Follow a regular exercise program to stay fit. This will help you maintain your balance. Ask your health care provider what types of exercise are appropriate for you.  If you need a cane or   walker, use it as recommended by your health care provider.  Wear supportive shoes that have nonskid soles. Lifestyle  Do not drink alcohol if your health care provider tells you not to drink.  If you drink alcohol, limit how much you have: ? 0-1 drink a day for women. ? 0-2 drinks a day for men.  Be aware of how much alcohol is in your drink. In the [place] one drink equals one typical bottle of beer (12  oz), one-half glass of wine (5 oz), or one shot of hard liquor (1 oz).  Do not use any products that contain nicotine or tobacco, such as cigarettes and e-cigarettes. If you need help quitting, ask your health care provider. Summary  Having a healthy lifestyle and getting preventive care can help to protect your health and wellness after age 74.  Screening and testing are the best way to find a health problem early and help you avoid having a fall. Early diagnosis and treatment give you the best chance for managing medical conditions that are more common for people who are older than age 74.  Falls are a major cause of broken bones and head injuries in people who are older than age 74. Take precautions to prevent a fall at home.  Work with your health care provider to learn what changes you can make to improve your health and wellness and to prevent falls. This information is not intended to replace advice given to you by your health care provider. Make sure you discuss any questions you have with your health care provider. Document Released: 12/03/2016 Document Revised: 12/03/2016 Document Reviewed: 12/03/2016 Elsevier Interactive Patient Education  2019 [employer]  

## 2018-04-27 ENCOUNTER — Other Ambulatory Visit: Payer: Self-pay | Admitting: Family

## 2018-04-27 DIAGNOSIS — E785 Hyperlipidemia, unspecified: Secondary | ICD-10-CM | POA: Insufficient documentation

## 2018-04-27 LAB — LIPID PANEL
Chol/HDL Ratio: 4.7 ratio — ABNORMAL HIGH (ref 0.0–4.4)
Cholesterol, Total: 226 mg/dL — ABNORMAL HIGH (ref 100–199)
HDL: 48 mg/dL (ref >39)
LDL Calculated: 136 mg/dL — ABNORMAL HIGH (ref 0–99)
Triglycerides: 211 mg/dL — ABNORMAL HIGH (ref 0–149)
VLDL Cholesterol Cal: 42 mg/dL — ABNORMAL HIGH (ref 5–40)

## 2018-04-27 LAB — CMP14+EGFR
ALT: 24 IU/L (ref 0–32)
AST: 25 IU/L (ref 0–40)
Albumin/Globulin Ratio: 1.8 (ref 1.2–2.2)
Albumin: 4.2 g/dL (ref 3.7–4.7)
Alkaline Phosphatase: 69 IU/L (ref 39–117)
BUN/Creatinine Ratio: 17 (ref 12–28)
BUN: 14 mg/dL (ref 8–27)
Bilirubin Total: 0.5 mg/dL (ref 0.0–1.2)
CO2: 23 mmol/L (ref 20–29)
Calcium: 9.1 mg/dL (ref 8.7–10.3)
Chloride: 104 mmol/L (ref 96–106)
Creatinine, Ser: 0.81 mg/dL (ref 0.57–1.00)
GFR calc Af Amer: 83 mL/min/{1.73_m2} (ref >59)
GFR calc non Af Amer: 72 mL/min/{1.73_m2} (ref >59)
GLUCOSE: 91 mg/dL (ref 65–99)
Globulin, Total: 2.3 g/dL (ref 1.5–4.5)
Potassium: 4.4 mmol/L (ref 3.5–5.2)
Sodium: 140 mmol/L (ref 134–144)
Total Protein: 6.5 g/dL (ref 6.0–8.5)

## 2018-04-27 LAB — CBC WITH DIFFERENTIAL/PLATELET
Basophils Absolute: 0 10*3/uL (ref 0.0–0.2)
Basos: 1 %
EOS (ABSOLUTE): 0.1 10*3/uL (ref 0.0–0.4)
Eos: 1 %
Hematocrit: 42.6 % (ref 34.0–46.6)
Hemoglobin: 14.1 g/dL (ref 11.1–15.9)
IMMATURE GRANULOCYTES: 0 %
Immature Grans (Abs): 0 10*3/uL (ref 0.0–0.1)
LYMPHS: 22 %
Lymphocytes Absolute: 1.2 10*3/uL (ref 0.7–3.1)
MCH: 30.1 pg (ref 26.6–33.0)
MCHC: 33.1 g/dL (ref 31.5–35.7)
MCV: 91 fL (ref 79–97)
Monocytes Absolute: 0.2 10*3/uL (ref 0.1–0.9)
Monocytes: 5 %
Neutrophils Absolute: 3.8 10*3/uL (ref 1.4–7.0)
Neutrophils: 71 %
PLATELETS: 221 10*3/uL (ref 150–450)
RBC: 4.68 x10E6/uL (ref 3.77–5.28)
RDW: 12.6 % (ref 11.7–15.4)
WBC: 5.3 10*3/uL (ref 3.4–10.8)

## 2018-04-27 LAB — VITAMIN D 25 HYDROXY (VIT D DEFICIENCY, FRACTURES): Vit D, 25-Hydroxy: 27 ng/mL — ABNORMAL LOW (ref 30.0–100.0)

## 2018-04-27 LAB — TSH: TSH: 2.39 u[IU]/mL (ref 0.450–4.500)

## 2018-04-27 MED ORDER — VITAMIN D (ERGOCALCIFEROL) 1.25 MG (50000 UNIT) PO CAPS: 50000.0000 [IU] | ORAL_CAPSULE | ORAL | 3 refills | Status: DC

## 2018-04-27 MED ORDER — ATORVASTATIN CALCIUM 20 MG PO TABS: 20.0000 mg | ORAL_TABLET | Freq: Every day | ORAL | 3 refills | Status: DC

## 2018-05-03 DIAGNOSIS — Z1212 Encounter for screening for malignant neoplasm of rectum: Secondary | ICD-10-CM | POA: Diagnosis not present

## 2018-05-03 DIAGNOSIS — Z1211 Encounter for screening for malignant neoplasm of colon: Secondary | ICD-10-CM | POA: Diagnosis not present

## 2018-05-04 DIAGNOSIS — M9901 Segmental and somatic dysfunction of cervical region: Secondary | ICD-10-CM | POA: Diagnosis not present

## 2018-05-04 DIAGNOSIS — M9902 Segmental and somatic dysfunction of thoracic region: Secondary | ICD-10-CM | POA: Diagnosis not present

## 2018-05-04 DIAGNOSIS — M9903 Segmental and somatic dysfunction of lumbar region: Secondary | ICD-10-CM | POA: Diagnosis not present

## 2018-05-06 LAB — COLOGUARD: Cologuard: NEGATIVE

## 2018-06-08 DIAGNOSIS — M9902 Segmental and somatic dysfunction of thoracic region: Secondary | ICD-10-CM | POA: Diagnosis not present

## 2018-06-08 DIAGNOSIS — M9903 Segmental and somatic dysfunction of lumbar region: Secondary | ICD-10-CM | POA: Diagnosis not present

## 2018-06-08 DIAGNOSIS — M9901 Segmental and somatic dysfunction of cervical region: Secondary | ICD-10-CM | POA: Diagnosis not present

## 2018-07-05 DIAGNOSIS — M9902 Segmental and somatic dysfunction of thoracic region: Secondary | ICD-10-CM | POA: Diagnosis not present

## 2018-07-05 DIAGNOSIS — M9903 Segmental and somatic dysfunction of lumbar region: Secondary | ICD-10-CM | POA: Diagnosis not present

## 2018-07-05 DIAGNOSIS — M9901 Segmental and somatic dysfunction of cervical region: Secondary | ICD-10-CM | POA: Diagnosis not present

## 2018-08-02 DIAGNOSIS — M9902 Segmental and somatic dysfunction of thoracic region: Secondary | ICD-10-CM | POA: Diagnosis not present

## 2018-08-02 DIAGNOSIS — M9901 Segmental and somatic dysfunction of cervical region: Secondary | ICD-10-CM | POA: Diagnosis not present

## 2018-08-02 DIAGNOSIS — M9903 Segmental and somatic dysfunction of lumbar region: Secondary | ICD-10-CM | POA: Diagnosis not present

## 2018-08-30 DIAGNOSIS — M9903 Segmental and somatic dysfunction of lumbar region: Secondary | ICD-10-CM | POA: Diagnosis not present

## 2018-08-30 DIAGNOSIS — M9902 Segmental and somatic dysfunction of thoracic region: Secondary | ICD-10-CM | POA: Diagnosis not present

## 2018-08-30 DIAGNOSIS — M9901 Segmental and somatic dysfunction of cervical region: Secondary | ICD-10-CM | POA: Diagnosis not present

## 2018-09-27 DIAGNOSIS — M9902 Segmental and somatic dysfunction of thoracic region: Secondary | ICD-10-CM | POA: Diagnosis not present

## 2018-09-27 DIAGNOSIS — M9901 Segmental and somatic dysfunction of cervical region: Secondary | ICD-10-CM | POA: Diagnosis not present

## 2018-09-27 DIAGNOSIS — M9903 Segmental and somatic dysfunction of lumbar region: Secondary | ICD-10-CM | POA: Diagnosis not present

## 2018-10-18 ENCOUNTER — Telehealth: Payer: Self-pay | Admitting: Family

## 2018-10-18 DIAGNOSIS — M9903 Segmental and somatic dysfunction of lumbar region: Secondary | ICD-10-CM | POA: Diagnosis not present

## 2018-10-18 DIAGNOSIS — M9901 Segmental and somatic dysfunction of cervical region: Secondary | ICD-10-CM | POA: Diagnosis not present

## 2018-10-18 DIAGNOSIS — M9902 Segmental and somatic dysfunction of thoracic region: Secondary | ICD-10-CM | POA: Diagnosis not present

## 2018-10-18 NOTE — Telephone Encounter (Signed)
appt scheduled Pt notified 

## 2018-10-20 ENCOUNTER — Other Ambulatory Visit: Payer: Self-pay

## 2018-10-21 ENCOUNTER — Ambulatory Visit (INDEPENDENT_AMBULATORY_CARE_PROVIDER_SITE_OTHER): Payer: PPO | Admitting: Family

## 2018-10-21 ENCOUNTER — Encounter: Payer: Self-pay | Admitting: Family

## 2018-10-21 ENCOUNTER — Other Ambulatory Visit: Payer: Self-pay

## 2018-10-21 VITALS — BP 121/71 | HR 81 | Temp 97.1°F | Resp 16 | Ht 60.0 in | Wt 143.6 lb

## 2018-10-21 DIAGNOSIS — L989 Disorder of the skin and subcutaneous tissue, unspecified: Secondary | ICD-10-CM

## 2018-10-21 NOTE — Progress Notes (Signed)
   Subjective:    Patient ID: Krista Price, female    DOB: 1945/06/27, 73 y.o.   MRN: [id number]  Chief Complaint  Patient presents with  . itching brown spot on back    been there for over a year    HPI Pt presents to the office today for pruritic lesion on the back of her neck. She states it have been there for about a year ago. She states she had a massage on Monday and was told she need to get his lesion looked at. She states she had a hx of basal cell on right thigh about 20-30 year ago and it started off itching.   Denies any pain.    Review of Systems     Objective:   Physical Exam Skin:    Findings: Lesion present.     Comments: #1X1.1 cm, irregular border, skin tone #2 0.9X0.5 cm , irregular border, purplish, bluish          BP 121/71   Pulse 81   Temp (!) 97.1 F (36.2 C) (Temporal)   Resp 16   Ht 5' (1.524 m)   Wt 143 lb 9.6 oz (65.1 kg)   SpO2 97%   BMI 28.04 kg/m      Assessment & Plan:  Krista Price comes in today with chief complaint of itching brown spot on back (been there for over a year)   Diagnosis and orders addressed:  1. Skin lesion of back Do not scratch or pick at Will do urgent referral to Derm given changes in appearance and itching RTO if symptoms worsen or do not improve  - Ambulatory referral to Dermatology   Evelina Dun, FNP

## 2018-10-21 NOTE — Patient Instructions (Signed)
Excision of Skin Lesions, Care After This sheet gives you information about how to care for yourself after your procedure. Your health care provider may also give you more specific instructions. If you have problems or questions, contact your health care provider. What can I expect after the procedure? After your procedure, it is common to have pain or discomfort at the excision site. Follow these instructions at home: Excision care   Follow instructions from your health care provider about how to take care of your excision site. Make sure you: ? Wash your hands with soap and water before and after you change your bandage (dressing). If soap and water are not available, use hand sanitizer. ? Change your dressing as told by your health care provider. ? Leave stitches (sutures), skin glue, or adhesive strips in place. These skin closures may need to stay in place for 2 weeks or longer. If adhesive strip edges start to loosen and curl up, you may trim the loose edges. Do not remove adhesive strips completely unless your health care provider tells you to do that.  Check the excision area every day for signs of infection. Watch for: ? Redness, swelling, or pain. ? Fluid or blood. ? Warmth. ? Pus or a bad smell.  Keep the site clean, dry, and protected for at least 48 hours.  For bleeding, apply gentle but firm pressure to the area using a folded towel for 20 minutes.  Avoid high-impact exercise and activities until the sutures are removed or the area heals. General instructions  Take over-the-counter and prescription medicines only as told by your health care provider.  Follow instructions from your health care provider about how to minimize scarring. Scarring should lessen over time.  Avoid sun exposure until the area has healed. Use sunscreen to protect the area from the sun after it has healed.  Keep all follow-up visits as told by your health care provider. This is important. Contact  a health care provider if:  You have redness, swelling, or pain around your excision site.  You have fluid or blood coming from your excision site.  Your excision site feels warm to the touch.  You have pus or a bad smell coming from your excision site.  You have a fever.  You have pain that does not improve in 2-3 days after your procedure.  You notice skin irregularities or changes in how you feel (sensation). Summary  This sheet of instructions provides you with information about caring for yourself after your procedure. Contact your health care provider if you have any problems or questions.  Take over-the-counter and prescription medicines only as told by your health care provider.  Change your dressing as told by your health care provider.  Contact a health care provider if you have redness, swelling, pain, or other signs of infection around your excision site.  Keep all follow-up visits as told by your health care provider. This is important. This information is not intended to replace advice given to you by your health care provider. Make sure you discuss any questions you have with your health care provider. Document Released: 06/06/2014 Document Revised: 07/29/2017 Document Reviewed: 07/29/2017 Elsevier Patient Education  2020 [employer]  

## 2018-11-08 DIAGNOSIS — L814 Other melanin hyperpigmentation: Secondary | ICD-10-CM | POA: Diagnosis not present

## 2018-11-08 DIAGNOSIS — D485 Neoplasm of uncertain behavior of skin: Secondary | ICD-10-CM | POA: Diagnosis not present

## 2018-11-08 DIAGNOSIS — L820 Inflamed seborrheic keratosis: Secondary | ICD-10-CM | POA: Diagnosis not present

## 2018-11-08 DIAGNOSIS — R238 Other skin changes: Secondary | ICD-10-CM | POA: Diagnosis not present

## 2018-11-15 DIAGNOSIS — M9901 Segmental and somatic dysfunction of cervical region: Secondary | ICD-10-CM | POA: Diagnosis not present

## 2018-11-15 DIAGNOSIS — M9902 Segmental and somatic dysfunction of thoracic region: Secondary | ICD-10-CM | POA: Diagnosis not present

## 2018-11-15 DIAGNOSIS — M9903 Segmental and somatic dysfunction of lumbar region: Secondary | ICD-10-CM | POA: Diagnosis not present

## 2018-12-13 DIAGNOSIS — D225 Melanocytic nevi of trunk: Secondary | ICD-10-CM | POA: Diagnosis not present

## 2018-12-13 DIAGNOSIS — L820 Inflamed seborrheic keratosis: Secondary | ICD-10-CM | POA: Diagnosis not present

## 2018-12-13 DIAGNOSIS — D1801 Hemangioma of skin and subcutaneous tissue: Secondary | ICD-10-CM | POA: Diagnosis not present

## 2018-12-13 DIAGNOSIS — L821 Other seborrheic keratosis: Secondary | ICD-10-CM | POA: Diagnosis not present

## 2018-12-13 DIAGNOSIS — L814 Other melanin hyperpigmentation: Secondary | ICD-10-CM | POA: Diagnosis not present

## 2018-12-13 DIAGNOSIS — L570 Actinic keratosis: Secondary | ICD-10-CM | POA: Diagnosis not present

## 2018-12-20 DIAGNOSIS — M9901 Segmental and somatic dysfunction of cervical region: Secondary | ICD-10-CM | POA: Diagnosis not present

## 2018-12-20 DIAGNOSIS — M9903 Segmental and somatic dysfunction of lumbar region: Secondary | ICD-10-CM | POA: Diagnosis not present

## 2018-12-20 DIAGNOSIS — M9902 Segmental and somatic dysfunction of thoracic region: Secondary | ICD-10-CM | POA: Diagnosis not present

## 2019-01-17 DIAGNOSIS — M9903 Segmental and somatic dysfunction of lumbar region: Secondary | ICD-10-CM | POA: Diagnosis not present

## 2019-01-17 DIAGNOSIS — M9901 Segmental and somatic dysfunction of cervical region: Secondary | ICD-10-CM | POA: Diagnosis not present

## 2019-01-17 DIAGNOSIS — M9902 Segmental and somatic dysfunction of thoracic region: Secondary | ICD-10-CM | POA: Diagnosis not present

## 2019-01-31 DIAGNOSIS — Z803 Family history of malignant neoplasm of breast: Secondary | ICD-10-CM | POA: Diagnosis not present

## 2019-01-31 DIAGNOSIS — Z1231 Encounter for screening mammogram for malignant neoplasm of breast: Secondary | ICD-10-CM | POA: Diagnosis not present

## 2019-02-14 DIAGNOSIS — M9902 Segmental and somatic dysfunction of thoracic region: Secondary | ICD-10-CM | POA: Diagnosis not present

## 2019-02-14 DIAGNOSIS — M9903 Segmental and somatic dysfunction of lumbar region: Secondary | ICD-10-CM | POA: Diagnosis not present

## 2019-02-14 DIAGNOSIS — M9901 Segmental and somatic dysfunction of cervical region: Secondary | ICD-10-CM | POA: Diagnosis not present

## 2019-03-14 DIAGNOSIS — M9901 Segmental and somatic dysfunction of cervical region: Secondary | ICD-10-CM | POA: Diagnosis not present

## 2019-03-14 DIAGNOSIS — M9902 Segmental and somatic dysfunction of thoracic region: Secondary | ICD-10-CM | POA: Diagnosis not present

## 2019-03-14 DIAGNOSIS — M9903 Segmental and somatic dysfunction of lumbar region: Secondary | ICD-10-CM | POA: Diagnosis not present

## 2019-03-21 DIAGNOSIS — D1801 Hemangioma of skin and subcutaneous tissue: Secondary | ICD-10-CM | POA: Diagnosis not present

## 2019-03-21 DIAGNOSIS — L821 Other seborrheic keratosis: Secondary | ICD-10-CM | POA: Diagnosis not present

## 2019-03-21 DIAGNOSIS — L905 Scar conditions and fibrosis of skin: Secondary | ICD-10-CM | POA: Diagnosis not present

## 2019-03-21 DIAGNOSIS — L814 Other melanin hyperpigmentation: Secondary | ICD-10-CM | POA: Diagnosis not present

## 2019-04-12 ENCOUNTER — Ambulatory Visit (INDEPENDENT_AMBULATORY_CARE_PROVIDER_SITE_OTHER): Payer: PPO

## 2019-04-12 DIAGNOSIS — Z0000 Encounter for general adult medical examination without abnormal findings: Secondary | ICD-10-CM

## 2019-04-12 NOTE — Progress Notes (Signed)
MEDICARE ANNUAL WELLNESS VISIT  04/12/2019  Telephone Visit Disclaimer This Medicare AWV was conducted by telephone due to national recommendations for restrictions regarding the COVID-19 Pandemic (e.g. social distancing).  I verified, using two identifiers, that I am speaking with Krista Price or their authorized healthcare agent. I discussed the limitations, risks, security, and privacy concerns of performing an evaluation and management service by telephone and the potential availability of an in-person appointment in the future. The patient expressed understanding and agreed to proceed.   Subjective:  Krista Price is a 74 y.o. female patient of Hawks, Theador Hawthorne, FNP who had a Medicare Annual Wellness Visit today via telephone. Ellora is Retired and lives alone. She has two children. She reports that she is socially active and does interact with friends/family regularly. She  is moderately physically active and enjoys reading and exercising in her spare time. .  Patient Care Team: Sharion Balloon, FNP as PCP - General (Family Medicine) Iver Nestle, DC as Referring Physician (Chiropractic Medicine) Herold Harms (Physician Assistant) Lajean Manes, MD as Consulting Physician (Diagnostic Radiology)  Advanced Directives 04/12/2019 04/09/2018 07/21/2016  Does Patient Have a Medical Advance Directive? Yes No No  Type of Paramedic of [place];Living will - -  Copy of [place] in Chart? No - copy requested - -  Would patient like information on creating a medical advance directive? - Yes (MAU/Ambulatory/Procedural Areas - Information given) Yes (ED - Information included in AVS)    Hospital Utilization Over the Past 12 Months: # of hospitalizations or ER visits: 0 # of surgeries: 0  Review of Systems    Patient reports that her overall health is unchanged compared to last year.  History obtained from chart review and the  patient  Patient Reported Readings (BP, Pulse, CBG, Weight, etc) none  Pain Assessment Pain : No/denies pain     Current Medications & Allergies (verified) Allergies as of 04/12/2019   No Known Allergies     Medication List       Accurate as of April 12, 2019  8:33 AM. If you have any questions, ask your nurse or doctor.        STOP taking these medications   Aloe Liqd   glucosamine-chondroitin 500-400 MG tablet     TAKE these medications   b complex vitamins capsule Take 1 capsule by mouth daily.   Calcium 600 + Minerals 600-200 MG-UNIT Tabs Take 1 tablet by mouth daily.   MULTIPLE VITAMINS/WOMENS PO Take 1 tablet by mouth daily.   PreserVision AREDS 2 Caps Take by mouth.   Vitamin D (Cholecalciferol) 25 MCG (1000 UT) Tabs Take 1,000 Units by mouth daily.       History (reviewed): History reviewed. No pertinent past medical history. Past Surgical History:  Procedure Laterality Date  . CATARACT EXTRACTION    . LIPOMA EXCISION     Family History  Problem Relation Age of Onset  . Cancer Mother        breast  . Cancer Father        Lung  . Heart disease Father   . Diabetes Brother   . Hyperlipidemia Daughter   . Heart disease Maternal Grandfather   . Heart disease Paternal Grandfather    Social History   Socioeconomic History  . Marital status: Widowed    Spouse name: Not on file  . Number of children: 2  . Years of education: 47  .  Highest education level: High school graduate  Occupational History  . Occupation: Retired    Comment: [employer]   Tobacco Use  . Smoking status: Never Smoker  . Smokeless tobacco: Never Used  Substance and Sexual Activity  . Alcohol use: No    Alcohol/week: 0.0 standard drinks  . Drug use: No  . Sexual activity: Not Currently  Other Topics Concern  . Not on file  Social History Narrative  . Not on file   Social Determinants of Health   Financial Resource Strain:   . Difficulty of Paying  Living Expenses: Not on file  Food Insecurity:   . Worried About Charity fundraiser in the Last Year: Not on file  . Ran Out of Food in the Last Year: Not on file  Transportation Needs:   . Lack of Transportation (Medical): Not on file  . Lack of Transportation (Non-Medical): Not on file  Physical Activity:   . Days of Exercise per Week: Not on file  . Minutes of Exercise per Session: Not on file  Stress:   . Feeling of Stress : Not on file  Social Connections:   . Frequency of Communication with Friends and Family: Not on file  . Frequency of Social Gatherings with Friends and Family: Not on file  . Attends Religious Services: Not on file  . Active Member of Clubs or Organizations: Not on file  . Attends Archivist Meetings: Not on file  . Marital Status: Not on file    Activities of Daily Living In your present state of health, do you have any difficulty performing the following activities: 04/12/2019  Hearing? N  Vision? N  Difficulty concentrating or making decisions? N  Walking or climbing stairs? N  Dressing or bathing? N  Doing errands, shopping? N  Preparing Food and eating ? N  Using the Toilet? N  In the past six months, have you accidently leaked urine? Y  Comment some slight urine leakage at times  Do you have problems with loss of bowel control? N  Managing your Medications? N  Managing your Finances? N  Housekeeping or managing your Housekeeping? N  Some recent data might be hidden    Patient Education/ Literacy How often do you need to have someone help you when you read instructions, pamphlets, or other written materials from your doctor or pharmacy?: 1 - Never What is the last grade level you completed in school?: 12th  Exercise Current Exercise Habits: Home exercise routine, Type of exercise: walking, Time (Minutes): 60, Frequency (Times/Week): 7, Weekly Exercise (Minutes/Week): 420, Intensity: Mild  Diet Patient reports consuming 2 meals a  day and 1 snack(s) a day Patient reports that her primary diet is: Regular Patient reports that she does have regular access to food.   Depression Screen PHQ 2/9 Scores 04/12/2019 10/21/2018 04/26/2018 04/09/2018 10/02/2016 09/01/2016 07/21/2016  PHQ - 2 Score 0 0 0 0 0 0 0     Fall Risk Fall Risk  04/12/2019 10/21/2018 04/26/2018 04/09/2018 10/02/2016  Falls in the past year? 0 0 0 0 No     Objective:  Krista Price seemed alert and oriented and she participated appropriately during our telephone visit.  Blood Pressure Weight BMI  BP Readings from Last 3 Encounters:  10/21/18 121/71  04/26/18 121/68  04/09/18 123/67   Wt Readings from Last 3 Encounters:  10/21/18 143 lb 9.6 oz (65.1 kg)  04/26/18 149 lb 6.4 oz (67.8 kg)  04/09/18 150  lb (68 kg)   BMI Readings from Last 1 Encounters:  10/21/18 28.04 kg/m    *Unable to obtain current vital signs, weight, and BMI due to telephone visit type  Hearing/Vision  . Odalis did not seem to have difficulty with hearing/understanding during the telephone conversation . Reports that she has not had a formal eye exam by an eye care professional within the past year . Reports that she has not had a formal hearing evaluation within the past year *Unable to fully assess hearing and vision during telephone visit type  Cognitive Function: 6CIT Screen 04/12/2019  What Year? 0 points  What month? 0 points  What time? 0 points  Count back from 20 0 points  Months in reverse 0 points  Repeat phrase 0 points  Total Score 0   (Normal:0-7, Significant for Dysfunction: >8)  Normal Cognitive Function Screening: Yes   Immunization & Health Maintenance Record  There is no immunization history on file for this patient.  Health Maintenance  Topic Date Due  . Hepatitis C Screening  Jun 05, 1945  . TETANUS/TDAP  04/13/1963  . COLONOSCOPY  04/13/1994  . PNA vac Low Risk Adult (1 of 2 - PCV13) 04/12/2009  . DEXA SCAN  04/08/2020  . MAMMOGRAM  01/30/2021    . INFLUENZA VACCINE  Discontinued       Assessment  This is a routine wellness examination for Krista Price.  Health Maintenance: Due or Overdue Health Maintenance Due  Topic Date Due  . Hepatitis C Screening  Sep 20, 1945  . TETANUS/TDAP  04/13/1963  . COLONOSCOPY  04/13/1994  . PNA vac Low Risk Adult (1 of 2 - PCV13) 04/12/2009    Krista Price does not need a referral for Community Assistance: Care Management:   no Social Work:    no Prescription Assistance:  no Nutrition/Diabetes Education:  no   Plan:  Personalized Goals Goals Addressed            This Visit's Progress   . Patient Stated       04/12/2019 AWV Goal: Keep All Scheduled Appointments  Over the next year, patient will attend all scheduled appointments with their PCP and any specialists that they see.       Personalized Health Maintenance & Screening Recommendations  Pneumococcal vaccine  Td vaccine  Lung Cancer Screening Recommended: no (Low Dose CT Chest recommended if Age 66-80 years, 30 pack-year currently smoking OR have quit w/in past 15 years) Hepatitis C Screening recommended: no HIV Screening recommended: no  Advanced Directives: Written information was not prepared per patient's request.  Referrals & Orders No orders of the defined types were placed in this encounter.   Follow-up Plan . Follow-up with Sharion Balloon, FNP as planned . Tdap vaccine . Pneumoccal vaccine . Shingrix vaccine .    I have personally reviewed and noted the following in the patient's chart:   . Medical and social history . Use of alcohol, tobacco or illicit drugs  . Current medications and supplements . Functional ability and status . Nutritional status . Physical activity . Advanced directives . List of other physicians . Hospitalizations, surgeries, and ER visits in previous 12 months . Vitals . Screenings to include cognitive, depression, and falls . Referrals and appointments  In  addition, I have reviewed and discussed with Krista Price certain preventive protocols, quality metrics, and best practice recommendations. A written personalized care plan for preventive services as well as general preventive health recommendations is available and  can be mailed to the patient at her request.      Felicity Coyer, LPN   [id number]

## 2019-05-09 DIAGNOSIS — M9903 Segmental and somatic dysfunction of lumbar region: Secondary | ICD-10-CM | POA: Diagnosis not present

## 2019-05-09 DIAGNOSIS — M9902 Segmental and somatic dysfunction of thoracic region: Secondary | ICD-10-CM | POA: Diagnosis not present

## 2019-05-09 DIAGNOSIS — M9901 Segmental and somatic dysfunction of cervical region: Secondary | ICD-10-CM | POA: Diagnosis not present

## 2019-06-06 DIAGNOSIS — M9901 Segmental and somatic dysfunction of cervical region: Secondary | ICD-10-CM | POA: Diagnosis not present

## 2019-06-06 DIAGNOSIS — M9902 Segmental and somatic dysfunction of thoracic region: Secondary | ICD-10-CM | POA: Diagnosis not present

## 2019-06-06 DIAGNOSIS — M9903 Segmental and somatic dysfunction of lumbar region: Secondary | ICD-10-CM | POA: Diagnosis not present

## 2019-07-11 DIAGNOSIS — M9901 Segmental and somatic dysfunction of cervical region: Secondary | ICD-10-CM | POA: Diagnosis not present

## 2019-07-11 DIAGNOSIS — M9903 Segmental and somatic dysfunction of lumbar region: Secondary | ICD-10-CM | POA: Diagnosis not present

## 2019-07-11 DIAGNOSIS — M9902 Segmental and somatic dysfunction of thoracic region: Secondary | ICD-10-CM | POA: Diagnosis not present

## 2019-08-08 DIAGNOSIS — M9901 Segmental and somatic dysfunction of cervical region: Secondary | ICD-10-CM | POA: Diagnosis not present

## 2019-08-08 DIAGNOSIS — M9903 Segmental and somatic dysfunction of lumbar region: Secondary | ICD-10-CM | POA: Diagnosis not present

## 2019-08-08 DIAGNOSIS — M9902 Segmental and somatic dysfunction of thoracic region: Secondary | ICD-10-CM | POA: Diagnosis not present

## 2019-09-05 DIAGNOSIS — M9902 Segmental and somatic dysfunction of thoracic region: Secondary | ICD-10-CM | POA: Diagnosis not present

## 2019-09-05 DIAGNOSIS — M9901 Segmental and somatic dysfunction of cervical region: Secondary | ICD-10-CM | POA: Diagnosis not present

## 2019-09-05 DIAGNOSIS — M9903 Segmental and somatic dysfunction of lumbar region: Secondary | ICD-10-CM | POA: Diagnosis not present

## 2019-10-24 DIAGNOSIS — M9901 Segmental and somatic dysfunction of cervical region: Secondary | ICD-10-CM | POA: Diagnosis not present

## 2019-10-24 DIAGNOSIS — M9903 Segmental and somatic dysfunction of lumbar region: Secondary | ICD-10-CM | POA: Diagnosis not present

## 2019-10-24 DIAGNOSIS — M9902 Segmental and somatic dysfunction of thoracic region: Secondary | ICD-10-CM | POA: Diagnosis not present

## 2019-11-21 DIAGNOSIS — M9902 Segmental and somatic dysfunction of thoracic region: Secondary | ICD-10-CM | POA: Diagnosis not present

## 2019-11-21 DIAGNOSIS — M9901 Segmental and somatic dysfunction of cervical region: Secondary | ICD-10-CM | POA: Diagnosis not present

## 2019-11-21 DIAGNOSIS — M9903 Segmental and somatic dysfunction of lumbar region: Secondary | ICD-10-CM | POA: Diagnosis not present

## 2019-12-19 DIAGNOSIS — M9903 Segmental and somatic dysfunction of lumbar region: Secondary | ICD-10-CM | POA: Diagnosis not present

## 2019-12-19 DIAGNOSIS — M9901 Segmental and somatic dysfunction of cervical region: Secondary | ICD-10-CM | POA: Diagnosis not present

## 2019-12-19 DIAGNOSIS — M9902 Segmental and somatic dysfunction of thoracic region: Secondary | ICD-10-CM | POA: Diagnosis not present

## 2020-01-16 DIAGNOSIS — M9903 Segmental and somatic dysfunction of lumbar region: Secondary | ICD-10-CM | POA: Diagnosis not present

## 2020-01-16 DIAGNOSIS — M9902 Segmental and somatic dysfunction of thoracic region: Secondary | ICD-10-CM | POA: Diagnosis not present

## 2020-01-16 DIAGNOSIS — M9901 Segmental and somatic dysfunction of cervical region: Secondary | ICD-10-CM | POA: Diagnosis not present

## 2020-02-13 DIAGNOSIS — Z1231 Encounter for screening mammogram for malignant neoplasm of breast: Secondary | ICD-10-CM | POA: Diagnosis not present

## 2020-02-27 DIAGNOSIS — M9903 Segmental and somatic dysfunction of lumbar region: Secondary | ICD-10-CM | POA: Diagnosis not present

## 2020-02-27 DIAGNOSIS — M9901 Segmental and somatic dysfunction of cervical region: Secondary | ICD-10-CM | POA: Diagnosis not present

## 2020-02-27 DIAGNOSIS — M9902 Segmental and somatic dysfunction of thoracic region: Secondary | ICD-10-CM | POA: Diagnosis not present

## 2020-03-26 DIAGNOSIS — M9902 Segmental and somatic dysfunction of thoracic region: Secondary | ICD-10-CM | POA: Diagnosis not present

## 2020-03-26 DIAGNOSIS — M9903 Segmental and somatic dysfunction of lumbar region: Secondary | ICD-10-CM | POA: Diagnosis not present

## 2020-03-26 DIAGNOSIS — M9901 Segmental and somatic dysfunction of cervical region: Secondary | ICD-10-CM | POA: Diagnosis not present

## 2020-05-21 DIAGNOSIS — M9901 Segmental and somatic dysfunction of cervical region: Secondary | ICD-10-CM | POA: Diagnosis not present

## 2020-05-21 DIAGNOSIS — M9902 Segmental and somatic dysfunction of thoracic region: Secondary | ICD-10-CM | POA: Diagnosis not present

## 2020-05-21 DIAGNOSIS — M9903 Segmental and somatic dysfunction of lumbar region: Secondary | ICD-10-CM | POA: Diagnosis not present

## 2020-06-18 DIAGNOSIS — M9902 Segmental and somatic dysfunction of thoracic region: Secondary | ICD-10-CM | POA: Diagnosis not present

## 2020-06-18 DIAGNOSIS — M9903 Segmental and somatic dysfunction of lumbar region: Secondary | ICD-10-CM | POA: Diagnosis not present

## 2020-06-18 DIAGNOSIS — M9901 Segmental and somatic dysfunction of cervical region: Secondary | ICD-10-CM | POA: Diagnosis not present

## 2020-07-16 DIAGNOSIS — M9902 Segmental and somatic dysfunction of thoracic region: Secondary | ICD-10-CM | POA: Diagnosis not present

## 2020-07-16 DIAGNOSIS — M9901 Segmental and somatic dysfunction of cervical region: Secondary | ICD-10-CM | POA: Diagnosis not present

## 2020-07-16 DIAGNOSIS — M9903 Segmental and somatic dysfunction of lumbar region: Secondary | ICD-10-CM | POA: Diagnosis not present

## 2020-08-13 DIAGNOSIS — M9902 Segmental and somatic dysfunction of thoracic region: Secondary | ICD-10-CM | POA: Diagnosis not present

## 2020-08-13 DIAGNOSIS — M9901 Segmental and somatic dysfunction of cervical region: Secondary | ICD-10-CM | POA: Diagnosis not present

## 2020-08-13 DIAGNOSIS — M9903 Segmental and somatic dysfunction of lumbar region: Secondary | ICD-10-CM | POA: Diagnosis not present

## 2020-09-10 DIAGNOSIS — M9901 Segmental and somatic dysfunction of cervical region: Secondary | ICD-10-CM | POA: Diagnosis not present

## 2020-09-10 DIAGNOSIS — M9902 Segmental and somatic dysfunction of thoracic region: Secondary | ICD-10-CM | POA: Diagnosis not present

## 2020-09-10 DIAGNOSIS — M9903 Segmental and somatic dysfunction of lumbar region: Secondary | ICD-10-CM | POA: Diagnosis not present

## 2020-10-15 DIAGNOSIS — M9902 Segmental and somatic dysfunction of thoracic region: Secondary | ICD-10-CM | POA: Diagnosis not present

## 2020-10-15 DIAGNOSIS — M9901 Segmental and somatic dysfunction of cervical region: Secondary | ICD-10-CM | POA: Diagnosis not present

## 2020-10-15 DIAGNOSIS — M9903 Segmental and somatic dysfunction of lumbar region: Secondary | ICD-10-CM | POA: Diagnosis not present

## 2020-11-12 DIAGNOSIS — M25562 Pain in left knee: Secondary | ICD-10-CM | POA: Diagnosis not present

## 2020-11-12 DIAGNOSIS — M9902 Segmental and somatic dysfunction of thoracic region: Secondary | ICD-10-CM | POA: Diagnosis not present

## 2020-11-12 DIAGNOSIS — M25511 Pain in right shoulder: Secondary | ICD-10-CM | POA: Diagnosis not present

## 2020-11-12 DIAGNOSIS — M9903 Segmental and somatic dysfunction of lumbar region: Secondary | ICD-10-CM | POA: Diagnosis not present

## 2020-11-12 DIAGNOSIS — M9901 Segmental and somatic dysfunction of cervical region: Secondary | ICD-10-CM | POA: Diagnosis not present

## 2020-11-15 DIAGNOSIS — S46011A Strain of muscle(s) and tendon(s) of the rotator cuff of right shoulder, initial encounter: Secondary | ICD-10-CM | POA: Diagnosis not present

## 2020-11-15 DIAGNOSIS — M6281 Muscle weakness (generalized): Secondary | ICD-10-CM | POA: Diagnosis not present

## 2020-11-23 DIAGNOSIS — M6281 Muscle weakness (generalized): Secondary | ICD-10-CM | POA: Diagnosis not present

## 2020-11-23 DIAGNOSIS — S46011D Strain of muscle(s) and tendon(s) of the rotator cuff of right shoulder, subsequent encounter: Secondary | ICD-10-CM | POA: Diagnosis not present

## 2020-11-23 DIAGNOSIS — M7918 Myalgia, other site: Secondary | ICD-10-CM | POA: Diagnosis not present

## 2020-11-27 DIAGNOSIS — S46011D Strain of muscle(s) and tendon(s) of the rotator cuff of right shoulder, subsequent encounter: Secondary | ICD-10-CM | POA: Diagnosis not present

## 2020-11-27 DIAGNOSIS — M7918 Myalgia, other site: Secondary | ICD-10-CM | POA: Diagnosis not present

## 2020-11-27 DIAGNOSIS — M6281 Muscle weakness (generalized): Secondary | ICD-10-CM | POA: Diagnosis not present

## 2020-12-05 DIAGNOSIS — M7918 Myalgia, other site: Secondary | ICD-10-CM | POA: Diagnosis not present

## 2020-12-05 DIAGNOSIS — M6281 Muscle weakness (generalized): Secondary | ICD-10-CM | POA: Diagnosis not present

## 2020-12-05 DIAGNOSIS — S46011D Strain of muscle(s) and tendon(s) of the rotator cuff of right shoulder, subsequent encounter: Secondary | ICD-10-CM | POA: Diagnosis not present

## 2020-12-12 DIAGNOSIS — M6281 Muscle weakness (generalized): Secondary | ICD-10-CM | POA: Diagnosis not present

## 2020-12-12 DIAGNOSIS — S46011D Strain of muscle(s) and tendon(s) of the rotator cuff of right shoulder, subsequent encounter: Secondary | ICD-10-CM | POA: Diagnosis not present

## 2020-12-12 DIAGNOSIS — M7918 Myalgia, other site: Secondary | ICD-10-CM | POA: Diagnosis not present

## 2020-12-24 DIAGNOSIS — M9902 Segmental and somatic dysfunction of thoracic region: Secondary | ICD-10-CM | POA: Diagnosis not present

## 2020-12-24 DIAGNOSIS — M9901 Segmental and somatic dysfunction of cervical region: Secondary | ICD-10-CM | POA: Diagnosis not present

## 2020-12-24 DIAGNOSIS — M9903 Segmental and somatic dysfunction of lumbar region: Secondary | ICD-10-CM | POA: Diagnosis not present

## 2020-12-31 DIAGNOSIS — M6281 Muscle weakness (generalized): Secondary | ICD-10-CM | POA: Diagnosis not present

## 2020-12-31 DIAGNOSIS — M7918 Myalgia, other site: Secondary | ICD-10-CM | POA: Diagnosis not present

## 2020-12-31 DIAGNOSIS — S46011D Strain of muscle(s) and tendon(s) of the rotator cuff of right shoulder, subsequent encounter: Secondary | ICD-10-CM | POA: Diagnosis not present

## 2021-02-25 ENCOUNTER — Telehealth: Payer: Self-pay | Admitting: Family

## 2021-03-01 ENCOUNTER — Ambulatory Visit (INDEPENDENT_AMBULATORY_CARE_PROVIDER_SITE_OTHER): Payer: PPO

## 2021-03-01 VITALS — Ht 60.0 in | Wt 135.0 lb

## 2021-03-01 DIAGNOSIS — Z0000 Encounter for general adult medical examination without abnormal findings: Secondary | ICD-10-CM | POA: Diagnosis not present

## 2021-03-01 NOTE — Progress Notes (Signed)
Subjective:   Krista Price is a 76 y.o. female who presents for Medicare Annual (Subsequent) preventive examination.  Virtual Visit via Telephone Note  I connected with  Krista Price on 03/01/21 at  8:15 AM EST by telephone and verified that I am speaking with the correct person using two identifiers.  Location: Patient: Home Provider: [facility] Persons participating in the virtual visit: patient/Nurse Health Advisor   I discussed the limitations, risks, security and privacy concerns of performing an evaluation and management service by telephone and the availability of in person appointments. The patient expressed understanding and agreed to proceed.  Interactive audio and video telecommunications were attempted between this nurse and patient, however failed, due to patient having technical difficulties OR patient did not have access to video capability.  We continued and completed visit with audio only.  Some vital signs may be absent or patient reported.   Krista Woodfield E Yanira Tolsma, LPN   Review of Systems     Cardiac Risk Factors include: advanced age (>32men, >34 women);dyslipidemia     Objective:    Today's Vitals   03/01/21 0809  Weight: 135 lb (61.2 kg)  Height: 5' (1.524 m)   Body mass index is 26.37 kg/m.  Advanced Directives 03/01/2021 04/12/2019 04/09/2018 07/21/2016  Does Patient Have a Medical Advance Directive? Yes Yes No No  Type of Paramedic of [place];Living will [place];Living will - -  Copy of [place] in Chart? No - copy requested No - copy requested - -  Would patient like information on creating a medical advance directive? - - Yes (MAU/Ambulatory/Procedural Areas - Information given) Yes (ED - Information included in AVS)    Current Medications (verified) Outpatient Encounter Medications as of 03/01/2021  Medication Sig   b complex vitamins capsule Take 1 capsule by mouth daily.   Calcium  Carbonate-Vit D-Min (CALCIUM 600 + MINERALS) 600-200 MG-UNIT TABS Take 1 tablet by mouth daily.   Multiple Vitamins-Minerals (MULTIPLE VITAMINS/WOMENS PO) Take 1 tablet by mouth daily.   Multiple Vitamins-Minerals (PRESERVISION AREDS 2) CAPS Take by mouth.   Vitamin D, Cholecalciferol, 1000 UNITS TABS Take 1,000 Units by mouth daily.   No facility-administered encounter medications on file as of 03/01/2021.    Allergies (verified) Patient has no known allergies.   History: History reviewed. No pertinent past medical history. Past Surgical History:  Procedure Laterality Date   CATARACT EXTRACTION     LIPOMA EXCISION     Family History  Problem Relation Age of Onset   Cancer Mother        breast   Cancer Father        Lung   Heart disease Father    Diabetes Brother    Hyperlipidemia Daughter    Heart disease Maternal Grandfather    Heart disease Paternal Grandfather    Social History   Socioeconomic History   Marital status: Widowed    Spouse name: Not on file   Number of children: 2   Years of education: 12   Highest education level: High school graduate  Occupational History   Occupation: Retired    Comment: [employer]   Tobacco Use   Smoking status: Never   Smokeless tobacco: Never  Vaping Use   Vaping Use: Never used  Substance and Sexual Activity   Alcohol use: No    Alcohol/week: 0.0 standard drinks   Drug use: No   Sexual activity: Not Currently  Other Topics  Concern   Not on file  Social History Narrative   Lives alone - has a basement   Son in [place]   Social Determinants of Health   Financial Resource Strain: Low Risk    Difficulty of Paying Living Expenses: Not hard at all  Food Insecurity: No Food Insecurity   Worried About Charity fundraiser in the Last Year: Never true   Arboriculturist in the Last Year: Never true  Transportation Needs: No Transportation Needs   Lack of Transportation (Medical): No   Lack of Transportation  (Non-Medical): No  Physical Activity: Sufficiently Active   Days of Exercise per Week: 7 days   Minutes of Exercise per Session: 30 min  Stress: No Stress Concern Present   Feeling of Stress : Not at all  Social Connections: Moderately Integrated   Frequency of Communication with Friends and Family: More than three times a week   Frequency of Social Gatherings with Friends and Family: More than three times a week   Attends Religious Services: More than 4 times per year   Active Member of [employer] or Organizations: Yes   Attends Archivist Meetings: More than 4 times per year   Marital Status: Widowed    Tobacco Counseling Counseling given: Not Answered   Clinical Intake:  Pre-visit preparation completed: Yes  Pain : No/denies pain     BMI - recorded: 26.37 Nutritional Status: BMI 25 -29 Overweight Nutritional Risks: None Diabetes: No  How often do you need to have someone help you when you read instructions, pamphlets, or other written materials from your doctor or pharmacy?: 1 - Never  Diabetic? no  Interpreter Needed?: No  Information entered by :: Krista Boller, LPN   Activities of Daily Living In your present state of health, do you have any difficulty performing the following activities: 03/01/2021  Hearing? N  Vision? N  Difficulty concentrating or making decisions? N  Walking or climbing stairs? N  Dressing or bathing? N  Doing errands, shopping? N  Preparing Food and eating ? N  Using the Toilet? N  In the past six months, have you accidently leaked urine? N  Do you have problems with loss of bowel control? N  Managing your Medications? N  Managing your Finances? N  Housekeeping or managing your Housekeeping? N  Some recent data might be hidden    Patient Care Team: Krista Balloon, FNP as PCP - General (Family Medicine) Iver Nestle, DC as Referring Physician (Chiropractic Medicine) Herold Harms (Physician Assistant) Lajean Manes, MD as Consulting Physician (Diagnostic Radiology)  Indicate any recent Medical Services you may have received from other than [facility] providers in the past year (date may be approximate).     Assessment:   This is a routine wellness examination for [place].  Hearing/Vision screen Hearing Screening - Comments:: Denies hearing difficulties  Vision Screening - Comments:: Wears readers prn - up to date with annual eye exams with [facility] in [place]  Dietary issues and exercise activities discussed: Current Exercise Habits: Home exercise routine, Type of exercise: walking;strength training/weights;stretching;yoga, Time (Minutes): 30, Frequency (Times/Week): 7, Weekly Exercise (Minutes/Week): 210, Intensity: Moderate, Exercise limited by: None identified   Goals Addressed   None    Depression Screen PHQ 2/9 Scores 03/01/2021 04/12/2019 10/21/2018 04/26/2018 04/09/2018 10/02/2016 09/01/2016  PHQ - 2 Score 0 0 0 0 0 0 0    Fall Risk Fall Risk  03/01/2021 04/12/2019 10/21/2018 04/26/2018 04/09/2018  Falls in the past year? 0 0 0 0 0  Number falls in past yr: 0 - - - -  Injury with Fall? 0 - - - -  Risk for fall due to : No Fall Risks - - - -  Follow up Falls prevention discussed - - - -    FALL RISK PREVENTION PERTAINING TO THE HOME:  Any stairs in or around the home? Yes  If so, are there any without handrails? No  Home free of loose throw rugs in walkways, pet beds, electrical cords, etc? Yes  Adequate lighting in your home to reduce risk of falls? Yes   ASSISTIVE DEVICES UTILIZED TO PREVENT FALLS:  Life alert? No  Use of a cane, walker or w/c? No  Grab bars in the bathroom? No  Shower chair or bench in shower? No  Elevated toilet seat or a handicapped toilet? No   TIMED UP AND GO:  Was the test performed? No . Telephonic visit  Cognitive Function: Normal cognitive status assessed by direct observation by this Nurse Health Advisor. No abnormalities found.   MMSE - Mini Mental  State Exam 04/09/2018 07/21/2016  Orientation to time 5 5  Orientation to Place 5 5  Registration 3 3  Attention/ Calculation 5 5  Recall 3 3  Language- name 2 objects 2 2  Language- repeat 1 1  Language- follow 3 step command 3 3  Language- read & follow direction 1 1  Write a sentence 1 1  Copy design 1 1  Total score 30 30     6CIT Screen 03/01/2021 04/12/2019  What Year? 0 points 0 points  What month? 0 points 0 points  What time? 0 points 0 points  Count back from 20 0 points 0 points  Months in reverse 0 points 0 points  Repeat phrase 0 points 0 points  Total Score 0 0    Immunizations  There is no immunization history on file for this patient.  TDAP status: Due, Education has been provided regarding the importance of this vaccine. Advised may receive this vaccine at local pharmacy or Health Dept. Aware to provide a copy of the vaccination record if obtained from local pharmacy or Health Dept. Verbalized acceptance and understanding.  Flu Vaccine status: Declined, Education has been provided regarding the importance of this vaccine but patient still declined. Advised may receive this vaccine at local pharmacy or Health Dept. Aware to provide a copy of the vaccination record if obtained from local pharmacy or Health Dept. Verbalized acceptance and understanding.  Pneumococcal vaccine status: Declined,  Education has been provided regarding the importance of this vaccine but patient still declined. Advised may receive this vaccine at local pharmacy or Health Dept. Aware to provide a copy of the vaccination record if obtained from local pharmacy or Health Dept. Verbalized acceptance and understanding.   Covid-19 vaccine status: Declined, Education has been provided regarding the importance of this vaccine but patient still declined. Advised may receive this vaccine at local pharmacy or Health Dept.or vaccine clinic. Aware to provide a copy of the vaccination record if obtained from  local pharmacy or Health Dept. Verbalized acceptance and understanding.  Qualifies for Shingles Vaccine? Yes   Zostavax completed No   Shingrix Completed?: No.    Education has been provided regarding the importance of this vaccine. Patient has been advised to call insurance company to determine out of pocket expense if they have not yet received this vaccine. Advised may also receive vaccine  at local pharmacy or Health Dept. Verbalized acceptance and understanding.  Screening Tests Health Maintenance  Topic Date Due   COVID-19 Vaccine (1) Never done   Hepatitis C Screening  Never done   TETANUS/TDAP  Never done   Zoster Vaccines- Shingrix (1 of 2) Never done   Pneumonia Vaccine 60+ Years old (1 - PCV) Never done   DEXA SCAN  04/08/2020   HPV VACCINES  Aged Out   INFLUENZA VACCINE  Discontinued    Health Maintenance  Health Maintenance Due  Topic Date Due   COVID-19 Vaccine (1) Never done   Hepatitis C Screening  Never done   TETANUS/TDAP  Never done   Zoster Vaccines- Shingrix (1 of 2) Never done   Pneumonia Vaccine 72+ Years old (1 - PCV) Never done   DEXA SCAN  04/08/2020    Colorectal cancer screening: Type of screening: Cologuard. Completed 05/03/2018. Repeat every 3 years  Mammogram status: Completed 02/22/2021. Repeat every year  Bone Density status: Completed 04/09/2018. Results reflect: Bone density results: OSTEOPENIA. Repeat every 2 years. Advised to do this at next visit  Lung Cancer Screening: (Low Dose CT Chest recommended if Age 77-80 years, 30 pack-year currently smoking OR have quit w/in 15years.) does not qualify  Additional Screening:  Hepatitis C Screening: does qualify; Due  Vision Screening: Recommended annual ophthalmology exams for early detection of glaucoma and other disorders of the eye. Is the patient up to date with their annual eye exam?  Yes  Who is the provider or what is the name of the office in which the patient attends annual eye exams?  [place] If pt is not established with a provider, would they like to be referred to a provider to establish care? No .   Dental Screening: Recommended annual dental exams for proper oral hygiene  Community Resource Referral / Chronic Care Management: CRR required this visit?  No   CCM required this visit?  No      Plan:     I have personally reviewed and noted the following in the patients chart:   Medical and social history Use of alcohol, tobacco or illicit drugs  Current medications and supplements including opioid prescriptions.  Functional ability and status Nutritional status Physical activity Advanced directives List of other physicians Hospitalizations, surgeries, and ER visits in previous 12 months Vitals Screenings to include cognitive, depression, and falls Referrals and appointments  In addition, I have reviewed and discussed with patient certain preventive protocols, quality metrics, and best practice recommendations. A written personalized care plan for preventive services as well as general preventive health recommendations were provided to patient.     Sandrea Hammond, LPN   3/50/0938   Nurse Notes: made appt for CPE as she hasn't been seen in over 2 years - she declines all vaccines.

## 2021-03-01 NOTE — Patient Instructions (Signed)
Ms. No , Thank you for taking time to come for your Medicare Wellness Visit. I appreciate your ongoing commitment to your health goals. Please review the following plan we discussed and let me know if I can assist you in the future.   Screening recommendations/referrals: Colonoscopy: Cologuard done 05/03/2018 repeat in 3 years Mammogram: Done 02/22/2021 - Repeat annually  Bone Density: Done 04/09/2018 - Repeat every 2 years *due Recommended yearly ophthalmology/optometry visit for glaucoma screening and checkup Recommended yearly dental visit for hygiene and checkup  Vaccinations: declines all vaccines Influenza vaccine: Due Pneumococcal vaccine: Due Tdap vaccine: Due Shingles vaccine: Due   Covid-19:Due  Advanced directives: Please bring a copy of your health care power of attorney and living will to the office to be added to your chart at your convenience.   Conditions/risks identified: Aim for 30 minutes of exercise or brisk walking each day, drink 6-8 glasses of water and eat lots of fruits and vegetables. Keep up the great work!   Next appointment: Follow up in one year for your annual wellness visit    Preventive Care 65 Years and Older, Female Preventive care refers to lifestyle choices and visits with your health care provider that can promote health and wellness. What does preventive care include? A yearly physical exam. This is also called an annual well check. Dental exams once or twice a year. Routine eye exams. Ask your health care provider how often you should have your eyes checked. Personal lifestyle choices, including: Daily care of your teeth and gums. Regular physical activity. Eating a healthy diet. Avoiding tobacco and drug use. Limiting alcohol use. Practicing safe sex. Taking low-dose aspirin every day. Taking vitamin and mineral supplements as recommended by your health care provider. What happens during an annual well check? The services and screenings  done by your health care provider during your annual well check will depend on your age, overall health, lifestyle risk factors, and family history of disease. Counseling  Your health care provider may ask you questions about your: Alcohol use. Tobacco use. Drug use. Emotional well-being. Home and relationship well-being. Sexual activity. Eating habits. History of falls. Memory and ability to understand (cognition). Work and work Statistician. Reproductive health. Screening  You may have the following tests or measurements: Height, weight, and BMI. Blood pressure. Lipid and cholesterol levels. These may be checked every 5 years, or more frequently if you are over 50 years old. Skin check. Lung cancer screening. You may have this screening every year starting at age 43 if you have a 30-pack-year history of smoking and currently smoke or have quit within the past 15 years. Fecal occult blood test (FOBT) of the stool. You may have this test every year starting at age 90. Flexible sigmoidoscopy or colonoscopy. You may have a sigmoidoscopy every 5 years or a colonoscopy every 10 years starting at age 67. Hepatitis C blood test. Hepatitis B blood test. Sexually transmitted disease (STD) testing. Diabetes screening. This is done by checking your blood sugar (glucose) after you have not eaten for a while (fasting). You may have this done every 1-3 years. Bone density scan. This is done to screen for osteoporosis. You may have this done starting at age 83. Mammogram. This may be done every 1-2 years. Talk to your health care provider about how often you should have regular mammograms. Talk with your health care provider about your test results, treatment options, and if necessary, the need for more tests. Vaccines  Your health care  provider may recommend certain vaccines, such as: Influenza vaccine. This is recommended every year. Tetanus, diphtheria, and acellular pertussis (Tdap, Td)  vaccine. You may need a Td booster every 10 years. Zoster vaccine. You may need this after age 69. Pneumococcal 13-valent conjugate (PCV13) vaccine. One dose is recommended after age 51. Pneumococcal polysaccharide (PPSV23) vaccine. One dose is recommended after age 36. Talk to your health care provider about which screenings and vaccines you need and how often you need them. This information is not intended to replace advice given to you by your health care provider. Make sure you discuss any questions you have with your health care provider. Document Released: 02/16/2015 Document Revised: 10/10/2015 Document Reviewed: 11/21/2014 Elsevier Interactive Patient Education  2017 [place] Prevention in the Home Falls can cause injuries. They can happen to people of all ages. There are many things you can do to make your home safe and to help prevent falls. What can I do on the outside of my home? Regularly fix the edges of walkways and driveways and fix any cracks. Remove anything that might make you trip as you walk through a door, such as a raised step or threshold. Trim any bushes or trees on the path to your home. Use bright outdoor lighting. Clear any walking paths of anything that might make someone trip, such as rocks or tools. Regularly check to see if handrails are loose or broken. Make sure that both sides of any steps have handrails. Any raised decks and porches should have guardrails on the edges. Have any leaves, snow, or ice cleared regularly. Use sand or salt on walking paths during winter. Clean up any spills in your garage right away. This includes oil or grease spills. What can I do in the bathroom? Use night lights. Install grab bars by the toilet and in the tub and shower. Do not use towel bars as grab bars. Use non-skid mats or decals in the tub or shower. If you need to sit down in the shower, use a plastic, non-slip stool. Keep the floor dry. Clean up any  water that spills on the floor as soon as it happens. Remove soap buildup in the tub or shower regularly. Attach bath mats securely with double-sided non-slip rug tape. Do not have throw rugs and other things on the floor that can make you trip. What can I do in the bedroom? Use night lights. Make sure that you have a light by your bed that is easy to reach. Do not use any sheets or blankets that are too big for your bed. They should not hang down onto the floor. Have a firm chair that has side arms. You can use this for support while you get dressed. Do not have throw rugs and other things on the floor that can make you trip. What can I do in the kitchen? Clean up any spills right away. Avoid walking on wet floors. Keep items that you use a lot in easy-to-reach places. If you need to reach something above you, use a strong step stool that has a grab bar. Keep electrical cords out of the way. Do not use floor polish or wax that makes floors slippery. If you must use wax, use non-skid floor wax. Do not have throw rugs and other things on the floor that can make you trip. What can I do with my stairs? Do not leave any items on the stairs. Make sure that there are handrails on both  sides of the stairs and use them. Fix handrails that are broken or loose. Make sure that handrails are as long as the stairways. Check any carpeting to make sure that it is firmly attached to the stairs. Fix any carpet that is loose or worn. Avoid having throw rugs at the top or bottom of the stairs. If you do have throw rugs, attach them to the floor with carpet tape. Make sure that you have a light switch at the top of the stairs and the bottom of the stairs. If you do not have them, ask someone to add them for you. What else can I do to help prevent falls? Wear shoes that: Do not have high heels. Have rubber bottoms. Are comfortable and fit you well. Are closed at the toe. Do not wear sandals. If you use a  stepladder: Make sure that it is fully opened. Do not climb a closed stepladder. Make sure that both sides of the stepladder are locked into place. Ask someone to hold it for you, if possible. Clearly mark and make sure that you can see: Any grab bars or handrails. First and last steps. Where the edge of each step is. Use tools that help you move around (mobility aids) if they are needed. These include: Canes. Walkers. Scooters. Crutches. Turn on the lights when you go into a dark area. Replace any light bulbs as soon as they burn out. Set up your furniture so you have a clear path. Avoid moving your furniture around. If any of your floors are uneven, fix them. If there are any pets around you, be aware of where they are. Review your medicines with your doctor. Some medicines can make you feel dizzy. This can increase your chance of falling. Ask your doctor what other things that you can do to help prevent falls. This information is not intended to replace advice given to you by your health care provider. Make sure you discuss any questions you have with your health care provider. Document Released: 11/16/2008 Document Revised: 06/28/2015 Document Reviewed: 02/24/2014 Elsevier Interactive Patient Education  2017 [employer].

## 2021-03-05 ENCOUNTER — Other Ambulatory Visit: Payer: Self-pay | Admitting: Family Medicine

## 2021-03-05 DIAGNOSIS — Z1211 Encounter for screening for malignant neoplasm of colon: Secondary | ICD-10-CM

## 2021-03-11 ENCOUNTER — Ambulatory Visit (INDEPENDENT_AMBULATORY_CARE_PROVIDER_SITE_OTHER): Payer: PPO | Admitting: Family

## 2021-03-11 ENCOUNTER — Encounter: Payer: Self-pay | Admitting: Family

## 2021-03-11 VITALS — BP 122/81 | HR 88 | Temp 97.8°F | Ht 62.0 in | Wt 142.6 lb

## 2021-03-11 DIAGNOSIS — Z1159 Encounter for screening for other viral diseases: Secondary | ICD-10-CM

## 2021-03-11 DIAGNOSIS — Z780 Asymptomatic menopausal state: Secondary | ICD-10-CM

## 2021-03-11 DIAGNOSIS — E663 Overweight: Secondary | ICD-10-CM | POA: Insufficient documentation

## 2021-03-11 DIAGNOSIS — Z0000 Encounter for general adult medical examination without abnormal findings: Secondary | ICD-10-CM

## 2021-03-11 DIAGNOSIS — E559 Vitamin D deficiency, unspecified: Secondary | ICD-10-CM

## 2021-03-11 DIAGNOSIS — Z0001 Encounter for general adult medical examination with abnormal findings: Secondary | ICD-10-CM

## 2021-03-11 DIAGNOSIS — E785 Hyperlipidemia, unspecified: Secondary | ICD-10-CM | POA: Diagnosis not present

## 2021-03-11 NOTE — Progress Notes (Signed)
Subjective:    Patient ID: Krista Price, female    DOB: 02-06-1945, 76 y.o.   MRN: [id number]  Chief Complaint  Patient presents with   Annual Exam   PT presents to the office today for CPE. She is currently taking a multivitamin. Pt denies any headache, palpitations, SOB, or edema at this time.   Hyperlipidemia This is a chronic problem. The current episode started more than 1 year ago. The problem is uncontrolled. Current antihyperlipidemic treatment includes diet change. The current treatment provides no improvement of lipids. Risk factors for coronary artery disease include dyslipidemia.     Review of Systems  All other systems reviewed and are negative.  Family History  Problem Relation Age of Onset   Cancer Mother        breast   Cancer Father        Lung   Heart disease Father    Diabetes Brother    Hyperlipidemia Daughter    Heart disease Maternal Grandfather    Heart disease Paternal Grandfather    Social History   Socioeconomic History   Marital status: Widowed    Spouse name: Not on file   Number of children: 2   Years of education: 12   Highest education level: High school graduate  Occupational History   Occupation: Retired    Comment: [employer]   Tobacco Use   Smoking status: Never   Smokeless tobacco: Never  Vaping Use   Vaping Use: Never used  Substance and Sexual Activity   Alcohol use: No    Alcohol/week: 0.0 standard drinks   Drug use: No   Sexual activity: Not Currently  Other Topics Concern   Not on file  Social History Narrative   Lives alone - has a basement   Son in [place] Strain: Low Risk    Difficulty of Paying Living Expenses: Not hard at all  Food Insecurity: No Food Insecurity   Worried About Charity fundraiser in the Last Year: Never true   [place] in the Last Year: Never true  Transportation Needs: No Transportation Needs   Lack of  Transportation (Medical): No   Lack of Transportation (Non-Medical): No  Physical Activity: Sufficiently Active   Days of Exercise per Week: 7 days   Minutes of Exercise per Session: 30 min  Stress: No Stress Concern Present   Feeling of Stress : Not at all  Social Connections: Moderately Integrated   Frequency of Communication with Friends and Family: More than three times a week   Frequency of Social Gatherings with Friends and Family: More than three times a week   Attends Religious Services: More than 4 times per year   Active Member of [employer] or Organizations: Yes   Attends Archivist Meetings: More than 4 times per year   Marital Status: Widowed       Objective:   Physical Exam Vitals reviewed.  Constitutional:      General: She is not in acute distress.    Appearance: She is well-developed.  HENT:     Head: Normocephalic and atraumatic.     Right Ear: Tympanic membrane normal.     Left Ear: Tympanic membrane normal.  Eyes:     Pupils: Pupils are equal, round, and reactive to light.  Neck:     Thyroid: No thyromegaly.  Cardiovascular:     Rate and Rhythm: Normal rate  and regular rhythm.     Heart sounds: Normal heart sounds. No murmur heard. Pulmonary:     Effort: Pulmonary effort is normal. No respiratory distress.     Breath sounds: Normal breath sounds. No wheezing.  Abdominal:     General: Bowel sounds are normal. There is no distension.     Palpations: Abdomen is soft.     Tenderness: There is no abdominal tenderness.  Musculoskeletal:        General: No tenderness. Normal range of motion.     Cervical back: Normal range of motion and neck supple.  Skin:    General: Skin is warm and dry.  Neurological:     Mental Status: She is alert and oriented to person, place, and time.     Cranial Nerves: No cranial nerve deficit.     Deep Tendon Reflexes: Reflexes are normal and symmetric.  Psychiatric:        Behavior: Behavior normal.        Thought  Content: Thought content normal.        Judgment: Judgment normal.         BP 122/81    Pulse 88    Temp 97.8 F (36.6 C)    Ht 5' 2"  (1.575 m)    Wt 142 lb 9.6 oz (64.7 kg)    SpO2 96%    BMI 26.08 kg/m   Assessment & Plan:  Krista Price comes in today with chief complaint of Annual Exam   Diagnosis and orders addressed:  1. Annual physical exam  - CMP14+EGFR - CBC with Differential/Platelet - Lipid panel - TSH - Hepatitis C antibody - VITAMIN D 25 Hydroxy (Vit-D Deficiency, Fractures) - DG [facility] DEXA; Future  2. Hyperlipidemia, unspecified hyperlipidemia type - CMP14+EGFR - CBC with Differential/Platelet  3. Vitamin D deficiency - CMP14+EGFR - CBC with Differential/Platelet  4. Overweight (BMI 25.0-29.9) - CMP14+EGFR - CBC with Differential/Platelet  5. Need for hepatitis C screening test - CMP14+EGFR - CBC with Differential/Platelet - Hepatitis C antibody  6. Post-menopausal - CMP14+EGFR - CBC with Differential/Platelet - DG [facility] DEXA; Future   Labs pending Health Maintenance reviewed Diet and exercise encouraged  Follow up plan: 1 year    Evelina Dun, FNP

## 2021-03-11 NOTE — Patient Instructions (Signed)
Health Maintenance After Age 77 After age 77, you are at a higher risk for certain long-term diseases and infections as well as injuries from falls. Falls are a major cause of broken bones and head injuries in people who are older than age 77. Getting regular preventive care can help to keep you healthy and well. Preventive care includes getting regular testing and making lifestyle changes as recommended by your health care provider. Talk with your health care provider about: Which screenings and tests you should have. A screening is a test that checks for a disease when you have no symptoms. A diet and exercise plan that is right for you. What should I know about screenings and tests to prevent falls? Screening and testing are the best ways to find a health problem early. Early diagnosis and treatment give you the best chance of managing medical conditions that are common after age 77. Certain conditions and lifestyle choices may make you more likely to have a fall. Your health care provider may recommend: Regular vision checks. Poor vision and conditions such as cataracts can make you more likely to have a fall. If you wear glasses, make sure to get your prescription updated if your vision changes. Medicine review. Work with your health care provider to regularly review all of the medicines you are taking, including over-the-counter medicines. Ask your health care provider about any side effects that may make you more likely to have a fall. Tell your health care provider if any medicines that you take make you feel dizzy or sleepy. Strength and balance checks. Your health care provider may recommend certain tests to check your strength and balance while standing, walking, or changing positions. Foot health exam. Foot pain and numbness, as well as not wearing proper footwear, can make you more likely to have a fall. Screenings, including: Osteoporosis screening. Osteoporosis is a condition that causes  the bones to get weaker and break more easily. Blood pressure screening. Blood pressure changes and medicines to control blood pressure can make you feel dizzy. Depression screening. You may be more likely to have a fall if you have a fear of falling, feel depressed, or feel unable to do activities that you used to do. Alcohol use screening. Using too much alcohol can affect your balance and may make you more likely to have a fall. Follow these instructions at home: Lifestyle Do not drink alcohol if: Your health care provider tells you not to drink. If you drink alcohol: Limit how much you have to: 0-1 drink a day for women. 0-2 drinks a day for men. Know how much alcohol is in your drink. In the [place] one drink equals one 12 oz bottle of beer (355 mL), one 5 oz glass of wine (148 mL), or one 1 oz glass of hard liquor (44 mL). Do not use any products that contain nicotine or tobacco. These products include cigarettes, chewing tobacco, and vaping devices, such as e-cigarettes. If you need help quitting, ask your health care provider. Activity  Follow a regular exercise program to stay fit. This will help you maintain your balance. Ask your health care provider what types of exercise are appropriate for you. If you need a cane or walker, use it as recommended by your health care provider. Wear supportive shoes that have nonskid soles. Safety  Remove any tripping hazards, such as rugs, cords, and clutter. Install safety equipment such as grab bars in bathrooms and safety rails on stairs. Keep rooms and walkways   well-lit. General instructions Talk with your health care provider about your risks for falling. Tell your health care provider if: You fall. Be sure to tell your health care provider about all falls, even ones that seem minor. You feel dizzy, tiredness (fatigue), or off-balance. Take over-the-counter and prescription medicines only as told by your health care provider. These include  supplements. Eat a healthy diet and maintain a healthy weight. A healthy diet includes low-fat dairy products, low-fat (lean) meats, and fiber from whole grains, beans, and lots of fruits and vegetables. Stay current with your vaccines. Schedule regular health, dental, and eye exams. Summary Having a healthy lifestyle and getting preventive care can help to protect your health and wellness after age 77. Screening and testing are the best way to find a health problem early and help you avoid having a fall. Early diagnosis and treatment give you the best chance for managing medical conditions that are more common for people who are older than age 77. Falls are a major cause of broken bones and head injuries in people who are older than age 77. Take precautions to prevent a fall at home. Work with your health care provider to learn what changes you can make to improve your health and wellness and to prevent falls. This information is not intended to replace advice given to you by your health care provider. Make sure you discuss any questions you have with your health care provider. Document Revised: 06/11/2020 Document Reviewed: 06/11/2020 Elsevier Patient Education  2022 Elsevier Inc.  

## 2021-03-12 ENCOUNTER — Other Ambulatory Visit: Payer: Self-pay | Admitting: Family

## 2021-03-12 LAB — CMP14+EGFR
ALT: 20 IU/L (ref 0–32)
AST: 20 IU/L (ref 0–40)
Albumin/Globulin Ratio: 1.6 (ref 1.2–2.2)
Albumin: 4.3 g/dL (ref 3.7–4.7)
Alkaline Phosphatase: 92 IU/L (ref 44–121)
BUN/Creatinine Ratio: 20 (ref 12–28)
BUN: 16 mg/dL (ref 8–27)
Bilirubin Total: <0.2 mg/dL (ref 0.0–1.2)
CO2: 24 mmol/L (ref 20–29)
Calcium: 9.8 mg/dL (ref 8.7–10.3)
Chloride: 102 mmol/L (ref 96–106)
Creatinine, Ser: 0.79 mg/dL (ref 0.57–1.00)
Globulin, Total: 2.7 g/dL (ref 1.5–4.5)
Glucose: 103 mg/dL — ABNORMAL HIGH (ref 70–99)
Potassium: 4.1 mmol/L (ref 3.5–5.2)
Sodium: 139 mmol/L (ref 134–144)
Total Protein: 7 g/dL (ref 6.0–8.5)
eGFR: 77 mL/min/{1.73_m2} (ref >59)

## 2021-03-12 LAB — CBC WITH DIFFERENTIAL/PLATELET
Basophils Absolute: 0 10*3/uL (ref 0.0–0.2)
Basos: 1 %
EOS (ABSOLUTE): 0.1 10*3/uL (ref 0.0–0.4)
Eos: 1 %
Hematocrit: 40.1 % (ref 34.0–46.6)
Hemoglobin: 14.1 g/dL (ref 11.1–15.9)
Immature Grans (Abs): 0 10*3/uL (ref 0.0–0.1)
Immature Granulocytes: 1 %
Lymphocytes Absolute: 1.4 10*3/uL (ref 0.7–3.1)
Lymphs: 21 %
MCH: 32 pg (ref 26.6–33.0)
MCHC: 35.2 g/dL (ref 31.5–35.7)
MCV: 91 fL (ref 79–97)
Monocytes Absolute: 0.4 10*3/uL (ref 0.1–0.9)
Monocytes: 6 %
Neutrophils Absolute: 4.7 10*3/uL (ref 1.4–7.0)
Neutrophils: 70 %
Platelets: 257 10*3/uL (ref 150–450)
RBC: 4.4 x10E6/uL (ref 3.77–5.28)
RDW: 12.6 % (ref 11.7–15.4)
WBC: 6.6 10*3/uL (ref 3.4–10.8)

## 2021-03-12 LAB — LIPID PANEL
Chol/HDL Ratio: 4.2 ratio (ref 0.0–4.4)
Cholesterol, Total: 228 mg/dL — ABNORMAL HIGH (ref 100–199)
HDL: 54 mg/dL (ref >39)
LDL Chol Calc (NIH): 129 mg/dL — ABNORMAL HIGH (ref 0–99)
Triglycerides: 257 mg/dL — ABNORMAL HIGH (ref 0–149)
VLDL Cholesterol Cal: 45 mg/dL — ABNORMAL HIGH (ref 5–40)

## 2021-03-12 LAB — HEPATITIS C ANTIBODY: Hep C Virus Ab: <0.1 s/co ratio (ref 0.0–0.9)

## 2021-03-12 LAB — TSH: TSH: 1.41 u[IU]/mL (ref 0.450–4.500)

## 2021-03-12 LAB — VITAMIN D 25 HYDROXY (VIT D DEFICIENCY, FRACTURES): Vit D, 25-Hydroxy: 28.8 ng/mL — ABNORMAL LOW (ref 30.0–100.0)

## 2021-03-12 MED ORDER — ROSUVASTATIN CALCIUM 5 MG PO TABS: 5.0000 mg | ORAL_TABLET | Freq: Every day | ORAL | 3 refills | Status: DC

## 2021-03-12 MED ORDER — VITAMIN D (ERGOCALCIFEROL) 1.25 MG (50000 UNIT) PO CAPS: 50000.0000 [IU] | ORAL_CAPSULE | ORAL | 3 refills | Status: DC

## 2021-04-29 ENCOUNTER — Other Ambulatory Visit: Payer: PPO

## 2021-04-30 ENCOUNTER — Other Ambulatory Visit: Payer: PPO

## 2021-05-06 ENCOUNTER — Ambulatory Visit (INDEPENDENT_AMBULATORY_CARE_PROVIDER_SITE_OTHER): Payer: PPO

## 2021-05-06 DIAGNOSIS — Z780 Asymptomatic menopausal state: Secondary | ICD-10-CM

## 2021-05-06 DIAGNOSIS — Z0000 Encounter for general adult medical examination without abnormal findings: Secondary | ICD-10-CM

## 2021-05-21 ENCOUNTER — Other Ambulatory Visit: Payer: Self-pay | Admitting: Family

## 2021-05-21 LAB — COLOGUARD: COLOGUARD: NEGATIVE

## 2021-05-31 ENCOUNTER — Ambulatory Visit (INDEPENDENT_AMBULATORY_CARE_PROVIDER_SITE_OTHER): Payer: PPO | Admitting: Family

## 2021-05-31 ENCOUNTER — Encounter: Payer: Self-pay | Admitting: Family

## 2021-05-31 VITALS — BP 141/61 | HR 65 | Temp 97.1°F | Ht 62.0 in | Wt 142.6 lb

## 2021-05-31 DIAGNOSIS — M5432 Sciatica, left side: Secondary | ICD-10-CM | POA: Diagnosis not present

## 2021-05-31 DIAGNOSIS — M5431 Sciatica, right side: Secondary | ICD-10-CM

## 2021-05-31 MED ORDER — DICLOFENAC SODIUM 75 MG PO TBEC: 75.0000 mg | DELAYED_RELEASE_TABLET | Freq: Two times a day (BID) | ORAL | 0 refills | Status: DC

## 2021-05-31 MED ORDER — PREDNISONE 10 MG (21) PO TBPK: ORAL_TABLET | ORAL | 0 refills | Status: DC

## 2021-05-31 NOTE — Progress Notes (Signed)
? ?Subjective:  ? ? Patient ID: Krista Price, female    DOB: May 04, 1945, 76 y.o.   MRN: [id number] ? ?Chief Complaint  ?Patient presents with  ? Referral  ?  PT- bilateral sciatica   ? ?Pt presents to the office today for bilateral sciatica pain that started several months ago. States she works out daily for 2 hours.  ?Back Pain ?This is a new problem. The current episode started more than 1 month ago. The problem occurs constantly. The problem has been waxing and waning since onset. The pain is present in the gluteal. The quality of the pain is described as burning. The pain radiates to the left thigh and right thigh. The pain is at a severity of 10/10. The pain is moderate. The symptoms are aggravated by twisting. She has tried bed rest and NSAIDs for the symptoms. The treatment provided mild relief.  ? ? ? ?Review of Systems  ?Musculoskeletal:  Positive for back pain.  ?All other systems reviewed and are negative. ? ?   ?Objective:  ? Physical Exam ?Vitals reviewed.  ?Constitutional:   ?   General: She is not in acute distress. ?   Appearance: She is well-developed. She is obese.  ?HENT:  ?   Head: Normocephalic and atraumatic.  ?Eyes:  ?   Pupils: Pupils are equal, round, and reactive to light.  ?Neck:  ?   Thyroid: No thyromegaly.  ?Cardiovascular:  ?   Rate and Rhythm: Normal rate and regular rhythm.  ?   Heart sounds: Normal heart sounds. No murmur heard. ?Pulmonary:  ?   Effort: Pulmonary effort is normal. No respiratory distress.  ?   Breath sounds: Normal breath sounds. No wheezing.  ?Abdominal:  ?   General: Bowel sounds are normal. There is no distension.  ?   Palpations: Abdomen is soft.  ?   Tenderness: There is no abdominal tenderness.  ?Musculoskeletal:     ?   General: No tenderness. Normal range of motion.  ?   Cervical back: Normal range of motion and neck supple.  ?   Comments: Full ROM of lumbar ?  ?Skin: ?   General: Skin is warm and dry.  ?Neurological:  ?   Mental Status: She is alert and  oriented to person, place, and time.  ?   Cranial Nerves: No cranial nerve deficit.  ?   Deep Tendon Reflexes: Reflexes are normal and symmetric.  ?Psychiatric:     ?   Behavior: Behavior normal.     ?   Thought Content: Thought content normal.     ?   Judgment: Judgment normal.  ? ? ? ?BP (!) 141/61   Pulse 65   Temp (!) 97.1 ?F (36.2 ?C) (Temporal)   Ht '5\' 2"'$  (1.575 m)   Wt 142 lb 9.6 oz (64.7 kg)   SpO2 100%   BMI 26.08 kg/m?  ? ? ?   ?Assessment & Plan:  ?Krista Price comes in today with chief complaint of Referral (PT- bilateral sciatica ) ? ? ?Diagnosis and orders addressed: ? ?1. Bilateral sciatica ?Rest ?No other NSAID's  ?Referral to PT ?Keep chiropractor appt ?- Ambulatory referral to Physical Therapy ?- predniSONE (STERAPRED UNI-PAK 21 TAB) 10 MG (21) TBPK tablet; Use as directed  Dispense: 21 tablet; Refill: 0 ?- diclofenac (VOLTAREN) 75 MG EC tablet; Take 1 tablet (75 mg total) by mouth 2 (two) times daily.  Dispense: 30 tablet; Refill: 0 ? ? ?Krista Dun, FNP ? ? ? ?

## 2021-05-31 NOTE — Patient Instructions (Signed)

## 2021-06-03 ENCOUNTER — Telehealth: Payer: Self-pay | Admitting: Family

## 2021-06-06 NOTE — Telephone Encounter (Signed)
Ok

## 2021-06-19 ENCOUNTER — Telehealth: Payer: Self-pay | Admitting: Family

## 2021-06-19 DIAGNOSIS — M5431 Sciatica, right side: Secondary | ICD-10-CM

## 2021-06-19 NOTE — Telephone Encounter (Signed)
?  Prescription Request ? ?06/19/2021 ? ?Is this a "Controlled Substance" medicine? No  ? ?Have you seen your PCP in the last 2 weeks? Last seen on 05/31/21 ? ?If YES, route message to pool  -  If NO, patient needs to be scheduled for appointment. ? ?What is the name of the medication or equipment? diclofenac (VOLTAREN) 75 MG EC tablet  ? ?Have you contacted your pharmacy to request a refill? yes  ? ?Which pharmacy would you like this sent to? [facility]  ? ? ?Patient notified that their request is being sent to the clinical staff for review and that they should receive a response within 2 business days.  ? ? ?

## 2021-06-19 NOTE — Telephone Encounter (Signed)
Spoke with pt. She takes that she is out of the medication. Informed pt that she should only be taking 2 tabs per day. Pt states that she sometimes only takes 1 tab per day. Informed pt that Krista Price will be back tomorrow and we will discuss. Not sure if this is a short term medication or one that needs to be continued.  Pt is also doing PT. ? ?Last refill was 4/28 #30 given ?

## 2021-06-21 MED ORDER — DICLOFENAC SODIUM 75 MG PO TBEC: 75.0000 mg | DELAYED_RELEASE_TABLET | Freq: Two times a day (BID) | ORAL | 2 refills | Status: DC

## 2021-06-21 NOTE — Telephone Encounter (Signed)
Prescription sent to pharmacy.

## 2021-07-08 ENCOUNTER — Telehealth: Payer: Self-pay | Admitting: Family

## 2021-07-08 DIAGNOSIS — M5431 Sciatica, right side: Secondary | ICD-10-CM

## 2021-07-08 NOTE — Telephone Encounter (Signed)
Referral to PT.

## 2021-08-19 ENCOUNTER — Telehealth: Payer: Self-pay | Admitting: Family

## 2021-08-19 NOTE — Telephone Encounter (Signed)
Please make her an appointment

## 2021-08-19 NOTE — Telephone Encounter (Signed)
Done

## 2021-08-20 ENCOUNTER — Encounter: Payer: Self-pay | Admitting: Family

## 2021-08-20 ENCOUNTER — Ambulatory Visit (INDEPENDENT_AMBULATORY_CARE_PROVIDER_SITE_OTHER): Payer: PPO | Admitting: Family

## 2021-08-20 VITALS — BP 105/58 | HR 100 | Temp 98.1°F | Ht 62.0 in | Wt 139.4 lb

## 2021-08-20 DIAGNOSIS — M5432 Sciatica, left side: Secondary | ICD-10-CM | POA: Diagnosis not present

## 2021-08-20 DIAGNOSIS — M5431 Sciatica, right side: Secondary | ICD-10-CM | POA: Diagnosis not present

## 2021-08-20 MED ORDER — BACLOFEN 10 MG PO TABS: 10.0000 mg | ORAL_TABLET | Freq: Three times a day (TID) | ORAL | 0 refills | Status: DC

## 2021-08-20 MED ORDER — DICLOFENAC SODIUM 75 MG PO TBEC: 75.0000 mg | DELAYED_RELEASE_TABLET | Freq: Two times a day (BID) | ORAL | 2 refills | Status: DC

## 2021-08-20 NOTE — Progress Notes (Signed)
Subjective:    Patient ID: Krista Price, female    DOB: 12-22-1945, 76 y.o.   MRN: [id number]  Chief Complaint  Patient presents with   Referral   Pt presents to the office today with sciatic pain that hast been on going for 5-6 months. She took diclofenac and prednisone with mild relief. Doing PT and dry needling with mild relief.  Back Pain This is a chronic problem. The current episode started more than 1 month ago. The problem occurs intermittently. The problem has been waxing and waning since onset. The pain is present in the lumbar spine. The quality of the pain is described as aching. The pain radiates to the left thigh and right thigh. The pain is at a severity of 8/10 (when it flares). The pain is moderate. The symptoms are aggravated by twisting and bending. Associated symptoms include numbness, tingling and weakness. She has tried bed rest, home exercises and NSAIDs for the symptoms. The treatment provided mild relief.      Review of Systems  Musculoskeletal:  Positive for back pain.  Neurological:  Positive for tingling, weakness and numbness.  All other systems reviewed and are negative.      Objective:   Physical Exam Vitals reviewed.  Constitutional:      General: She is not in acute distress.    Appearance: She is well-developed.  HENT:     Head: Normocephalic and atraumatic.  Eyes:     Pupils: Pupils are equal, round, and reactive to light.  Neck:     Thyroid: No thyromegaly.  Cardiovascular:     Rate and Rhythm: Normal rate and regular rhythm.     Heart sounds: Normal heart sounds. No murmur heard. Pulmonary:     Effort: Pulmonary effort is normal. No respiratory distress.     Breath sounds: Normal breath sounds. No wheezing.  Abdominal:     General: Bowel sounds are normal. There is no distension.     Palpations: Abdomen is soft.     Tenderness: There is no abdominal tenderness.  Musculoskeletal:        General: No tenderness. Normal range of  motion.     Cervical back: Normal range of motion and neck supple.     Comments: Full ROM, negative SLR   Skin:    General: Skin is warm and dry.  Neurological:     Mental Status: She is alert and oriented to person, place, and time.     Cranial Nerves: No cranial nerve deficit.     Deep Tendon Reflexes: Reflexes are normal and symmetric.  Psychiatric:        Behavior: Behavior normal.        Thought Content: Thought content normal.        Judgment: Judgment normal.     BP (!) 105/58   Pulse 100   Temp 98.1 F (36.7 C)   Ht '5\' 2"'$  (1.575 m)   Wt 139 lb 6.4 oz (63.2 kg)   SpO2 96%   BMI 25.50 kg/m        Assessment & Plan:  Krista Price comes in today with chief complaint of Referral   Diagnosis and orders addressed:  1. Bilateral sciatica Restart diclofenac BID, no other NSAID's Baclofen, sedation precautions discussed Continue PT and ROM exercises MRI pending  Follow up if symptoms worsen or do not improve  - baclofen (LIORESAL) 10 MG tablet; Take 1 tablet (10 mg total) by mouth 3 (three) times daily.  Dispense: 30 each; Refill: 0 - diclofenac (VOLTAREN) 75 MG EC tablet; Take 1 tablet (75 mg total) by mouth 2 (two) times daily.  Dispense: 60 tablet; Refill: 2 - MR Lumbar Spine Wo Contrast; Future    Evelina Dun, FNP

## 2021-08-20 NOTE — Patient Instructions (Signed)

## 2021-09-06 ENCOUNTER — Ambulatory Visit (HOSPITAL_COMMUNITY)
Admission: RE | Admit: 2021-09-06 | Discharge: 2021-09-06 | Disposition: A | Discharge status: Home / Self Care | Payer: PPO | Source: Ambulatory Visit | Attending: Family | Admitting: Family

## 2021-09-06 DIAGNOSIS — M5431 Sciatica, right side: Secondary | ICD-10-CM | POA: Insufficient documentation

## 2021-09-06 DIAGNOSIS — M5432 Sciatica, left side: Secondary | ICD-10-CM | POA: Insufficient documentation

## 2021-09-10 ENCOUNTER — Telehealth: Payer: Self-pay | Admitting: Family

## 2021-09-10 ENCOUNTER — Other Ambulatory Visit: Payer: Self-pay | Admitting: Family

## 2021-09-10 DIAGNOSIS — M5431 Sciatica, right side: Secondary | ICD-10-CM

## 2021-09-10 DIAGNOSIS — M5432 Sciatica, left side: Secondary | ICD-10-CM

## 2021-09-10 NOTE — Telephone Encounter (Signed)
Faxed

## 2021-09-25 ENCOUNTER — Encounter: Payer: Self-pay | Admitting: *Deleted

## 2022-02-27 ENCOUNTER — Encounter: Payer: Self-pay | Admitting: Family

## 2022-03-04 ENCOUNTER — Ambulatory Visit (INDEPENDENT_AMBULATORY_CARE_PROVIDER_SITE_OTHER): Payer: PPO

## 2022-03-04 VITALS — Ht 62.0 in | Wt 139.0 lb

## 2022-03-04 DIAGNOSIS — Z0000 Encounter for general adult medical examination without abnormal findings: Secondary | ICD-10-CM

## 2022-03-04 NOTE — Patient Instructions (Signed)
Krista Price , Thank you for taking time to come for your Medicare Wellness Visit. I appreciate your ongoing commitment to your health goals. Please review the following plan we discussed and let me know if I can assist you in the future.   These are the goals we discussed:  Goals       Patient Stated (pt-stated)      Continue healthy exercise and diet regimen.       Patient Stated      04/12/2019 AWV Goal: Keep All Scheduled Appointments  Over the next year, patient will attend all scheduled appointments with their PCP and any specialists that they see.         This is a list of the screening recommended for you and due dates:  Health Maintenance  Topic Date Due   COVID-19 Vaccine (1) Never done   DTaP/Tdap/Td vaccine (1 - Tdap) Never done   Zoster (Shingles) Vaccine (1 of 2) Never done   Flu Shot  Never done   Pneumonia Vaccine (1 - PCV) 03/11/2022*   Medicare Annual Wellness Visit  03/05/2023   DEXA scan (bone density measurement)  05/07/2023   Hepatitis C Screening: USPSTF Recommendation to screen - Ages 90-79 yo.  Completed   HPV Vaccine  Aged Out  *Topic was postponed. The date shown is not the original due date.    Advanced directives: In Chart   Conditions/risks identified: Aim for 30 minutes of exercise or brisk walking, 6-8 glasses of water, and 5 servings of fruits and vegetables each day.   Next appointment: Follow up in one year for your annual wellness visit    Preventive Care 65 Years and Older, Female Preventive care refers to lifestyle choices and visits with your health care provider that can promote health and wellness. What does preventive care include? A yearly physical exam. This is also called an annual well check. Dental exams once or twice a year. Routine eye exams. Ask your health care provider how often you should have your eyes checked. Personal lifestyle choices, including: Daily care of your teeth and gums. Regular physical activity. Eating a  healthy diet. Avoiding tobacco and drug use. Limiting alcohol use. Practicing safe sex. Taking low-dose aspirin every day. Taking vitamin and mineral supplements as recommended by your health care provider. What happens during an annual well check? The services and screenings done by your health care provider during your annual well check will depend on your age, overall health, lifestyle risk factors, and family history of disease. Counseling  Your health care provider may ask you questions about your: Alcohol use. Tobacco use. Drug use. Emotional well-being. Home and relationship well-being. Sexual activity. Eating habits. History of falls. Memory and ability to understand (cognition). Work and work Statistician. Reproductive health. Screening  You may have the following tests or measurements: Height, weight, and BMI. Blood pressure. Lipid and cholesterol levels. These may be checked every 5 years, or more frequently if you are over 47 years old. Skin check. Lung cancer screening. You may have this screening every year starting at age 60 if you have a 30-pack-year history of smoking and currently smoke or have quit within the past 15 years. Fecal occult blood test (FOBT) of the stool. You may have this test every year starting at age 93. Flexible sigmoidoscopy or colonoscopy. You may have a sigmoidoscopy every 5 years or a colonoscopy every 10 years starting at age 84. Hepatitis C blood test. Hepatitis B blood test. Sexually transmitted  disease (STD) testing. Diabetes screening. This is done by checking your blood sugar (glucose) after you have not eaten for a while (fasting). You may have this done every 1-3 years. Bone density scan. This is done to screen for osteoporosis. You may have this done starting at age 77. Mammogram. This may be done every 1-2 years. Talk to your health care provider about how often you should have regular mammograms. Talk with your health care  provider about your test results, treatment options, and if necessary, the need for more tests. Vaccines  Your health care provider may recommend certain vaccines, such as: Influenza vaccine. This is recommended every year. Tetanus, diphtheria, and acellular pertussis (Tdap, Td) vaccine. You may need a Td booster every 10 years. Zoster vaccine. You may need this after age 22. Pneumococcal 13-valent conjugate (PCV13) vaccine. One dose is recommended after age 15. Pneumococcal polysaccharide (PPSV23) vaccine. One dose is recommended after age 76. Talk to your health care provider about which screenings and vaccines you need and how often you need them. This information is not intended to replace advice given to you by your health care provider. Make sure you discuss any questions you have with your health care provider. Document Released: 02/16/2015 Document Revised: 10/10/2015 Document Reviewed: 11/21/2014 Elsevier Interactive Patient Education  2017 [employer] Prevention in the Home Falls can cause injuries. They can happen to people of all ages. There are many things you can do to make your home safe and to help prevent falls. What can I do on the outside of my home? Regularly fix the edges of walkways and driveways and fix any cracks. Remove anything that might make you trip as you walk through a door, such as a raised step or threshold. Trim any bushes or trees on the path to your home. Use bright outdoor lighting. Clear any walking paths of anything that might make someone trip, such as rocks or tools. Regularly check to see if handrails are loose or broken. Make sure that both sides of any steps have handrails. Any raised decks and porches should have guardrails on the edges. Have any leaves, snow, or ice cleared regularly. Use sand or salt on walking paths during winter. Clean up any spills in your garage right away. This includes oil or grease spills. What can I do in the  bathroom? Use night lights. Install grab bars by the toilet and in the tub and shower. Do not use towel bars as grab bars. Use non-skid mats or decals in the tub or shower. If you need to sit down in the shower, use a plastic, non-slip stool. Keep the floor dry. Clean up any water that spills on the floor as soon as it happens. Remove soap buildup in the tub or shower regularly. Attach bath mats securely with double-sided non-slip rug tape. Do not have throw rugs and other things on the floor that can make you trip. What can I do in the bedroom? Use night lights. Make sure that you have a light by your bed that is easy to reach. Do not use any sheets or blankets that are too big for your bed. They should not hang down onto the floor. Have a firm chair that has side arms. You can use this for support while you get dressed. Do not have throw rugs and other things on the floor that can make you trip. What can I do in the kitchen? Clean up any spills right away. Avoid walking  on wet floors. Keep items that you use a lot in easy-to-reach places. If you need to reach something above you, use a strong step stool that has a grab bar. Keep electrical cords out of the way. Do not use floor polish or wax that makes floors slippery. If you must use wax, use non-skid floor wax. Do not have throw rugs and other things on the floor that can make you trip. What can I do with my stairs? Do not leave any items on the stairs. Make sure that there are handrails on both sides of the stairs and use them. Fix handrails that are broken or loose. Make sure that handrails are as long as the stairways. Check any carpeting to make sure that it is firmly attached to the stairs. Fix any carpet that is loose or worn. Avoid having throw rugs at the top or bottom of the stairs. If you do have throw rugs, attach them to the floor with carpet tape. Make sure that you have a light switch at the top of the stairs and the  bottom of the stairs. If you do not have them, ask someone to add them for you. What else can I do to help prevent falls? Wear shoes that: Do not have high heels. Have rubber bottoms. Are comfortable and fit you well. Are closed at the toe. Do not wear sandals. If you use a stepladder: Make sure that it is fully opened. Do not climb a closed stepladder. Make sure that both sides of the stepladder are locked into place. Ask someone to hold it for you, if possible. Clearly mark and make sure that you can see: Any grab bars or handrails. First and last steps. Where the edge of each step is. Use tools that help you move around (mobility aids) if they are needed. These include: Canes. Walkers. Scooters. Crutches. Turn on the lights when you go into a dark area. Replace any light bulbs as soon as they burn out. Set up your furniture so you have a clear path. Avoid moving your furniture around. If any of your floors are uneven, fix them. If there are any pets around you, be aware of where they are. Review your medicines with your doctor. Some medicines can make you feel dizzy. This can increase your chance of falling. Ask your doctor what other things that you can do to help prevent falls. This information is not intended to replace advice given to you by your health care provider. Make sure you discuss any questions you have with your health care provider. Document Released: 11/16/2008 Document Revised: 06/28/2015 Document Reviewed: 02/24/2014 Elsevier Interactive Patient Education  2017 [employer].

## 2022-03-04 NOTE — Progress Notes (Signed)
Subjective:   Krista Price is a 77 y.o. female who presents for Medicare Annual (Subsequent) preventive examination. I connected with  Krista Price on 03/04/22 by a audio enabled telemedicine application and verified that I am speaking with the correct person using two identifiers.  Patient Location: Home  Provider Location: Home Office  I discussed the limitations of evaluation and management by telemedicine. The patient expressed understanding and agreed to proceed.  Review of Systems     Cardiac Risk Factors include: advanced age (>33mn, >>19women)     Objective:    Today's Vitals   03/04/22 0823  Weight: 139 lb (63 kg)  Height: '5\' 2"'$  (1.575 m)   Body mass index is 25.42 kg/m.     03/04/2022    8:27 AM 03/01/2021    8:18 AM 04/12/2019    8:25 AM 04/09/2018    8:51 AM 07/21/2016    4:59 PM 07/21/2016    4:04 PM  Advanced Directives  Does Patient Have a Medical Advance Directive? Yes Yes Yes No  No  Type of [place]of [place] will [place] will [place] AllenLiving will     Does patient want to make changes to medical advance directive? No - Patient declined       Copy of HCowleyin Chart? Yes - validated most recent copy scanned in chart (See row information) No - copy requested No - copy requested     Would patient like information on creating a medical advance directive?    Yes (MAU/Ambulatory/Procedural Areas - Information given) Yes (ED - Information included in AVS)     Current Medications (verified) Outpatient Encounter Medications as of 03/04/2022  Medication Sig   baclofen (LIORESAL) 10 MG tablet Take 1 tablet (10 mg total) by mouth 3 (three) times daily.   diclofenac (VOLTAREN) 75 MG EC tablet Take 1 tablet (75 mg total) by mouth 2 (two) times daily.   gabapentin (NEURONTIN) 300 MG capsule Take 300 mg by mouth at bedtime.   Multiple Vitamins-Minerals (MULTIPLE  VITAMINS/WOMENS PO) Take 1 tablet by mouth daily.   Vitamin D, Ergocalciferol, (DRISDOL) 1.25 MG (50000 UNIT) CAPS capsule Take 1 capsule (50,000 Units total) by mouth every 7 (seven) days.   No facility-administered encounter medications on file as of 03/04/2022.    Allergies (verified) Patient has no known allergies.   History: History reviewed. No pertinent past medical history. Past Surgical History:  Procedure Laterality Date   CATARACT EXTRACTION     LIPOMA EXCISION     Family History  Problem Relation Age of Onset   Cancer Mother        breast   Cancer Father        Lung   Heart disease Father    Diabetes Brother    Hyperlipidemia Daughter    Heart disease Maternal Grandfather    Heart disease Paternal Grandfather    Social History   Socioeconomic History   Marital status: Widowed    Spouse name: Not on file   Number of children: 2   Years of education: 12   Highest education level: High school graduate  Occupational History   Occupation: Retired    Comment: [employer]   Tobacco Use   Smoking status: Never   Smokeless tobacco: Never  Vaping Use   Vaping Use: Never used  Substance and Sexual Activity   Alcohol use: No    Alcohol/week: 0.0  standard drinks of alcohol   Drug use: No   Sexual activity: Not Currently  Other Topics Concern   Not on file  Social History Narrative   Lives alone - has a basement   Son in [place]   Social Determinants of Health   Financial Resource Strain: Low Risk  (03/04/2022)   Overall Financial Resource Strain (CARDIA)    Difficulty of Paying Living Expenses: Not hard at all  Food Insecurity: No Food Insecurity (03/04/2022)   Hunger Vital Sign    Worried About Running Out of Food in the Last Year: Never true    Ran Out of Food in the Last Year: Never true  Transportation Needs: No Transportation Needs (03/04/2022)   PRAPARE - Hydrologist (Medical): No    Lack of Transportation  (Non-Medical): No  Physical Activity: Sufficiently Active (03/04/2022)   Exercise Vital Sign    Days of Exercise per Week: 6 days    Minutes of Exercise per Session: 30 min  Stress: No Stress Concern Present (03/04/2022)   [place]    Feeling of Stress : Not at all  Social Connections: Moderately Integrated (03/04/2022)   Social Connection and Isolation Panel [NHANES]    Frequency of Communication with Friends and Family: More than three times a week    Frequency of Social Gatherings with Friends and Family: More than three times a week    Attends Religious Services: More than 4 times per year    Active Member of [employer] or Organizations: Yes    Attends Archivist Meetings: More than 4 times per year    Marital Status: Widowed    Tobacco Counseling Counseling given: Not Answered   Clinical Intake:  Pre-visit preparation completed: Yes  Pain : No/denies pain     Nutritional Risks: None Diabetes: No  How often do you need to have someone help you when you read instructions, pamphlets, or other written materials from your doctor or pharmacy?: 1 - Never  Diabetic?no   Interpreter Needed?: No  Information entered by :: Krista Pierini, LPN   Activities of Daily Living    03/04/2022    8:27 AM  In your present state of health, do you have any difficulty performing the following activities:  Hearing? 0  Vision? 0  Difficulty concentrating or making decisions? 0  Walking or climbing stairs? 0  Dressing or bathing? 0  Doing errands, shopping? 0  Preparing Food and eating ? N  Using the Toilet? N  In the past six months, have you accidently leaked urine? N  Do you have problems with loss of bowel control? N  Managing your Medications? N  Managing your Finances? N  Housekeeping or managing your Housekeeping? N    Patient Care Team: Krista Balloon, FNP as PCP - General (Family Medicine) Krista Price, DC as Referring Physician (Chiropractic Medicine) Krista Price (Physician Assistant) Krista Manes, MD as Consulting Physician (Diagnostic Radiology) Krista Price, OD (Optometry)  Indicate any recent Medical Services you may have received from other than [facility] providers in the past year (date may be approximate).     Assessment:   This is a routine wellness examination for [place].  Hearing/Vision screen Vision Screening - Comments:: Wears rx glasses - up to date with routine eye exams with  Dr.Miller  Dietary issues and exercise activities discussed: Current Exercise Habits: Home exercise routine, Type of exercise: walking, Time (  Minutes): 30, Frequency (Times/Week): 6, Weekly Exercise (Minutes/Week): 180, Intensity: Mild, Exercise limited by: None identified   Goals Addressed               This Visit's Progress     Patient Stated (pt-stated)   On track     Continue healthy exercise and diet regimen.        Depression Screen    03/04/2022    8:25 AM 08/20/2021   10:35 AM 05/31/2021    9:02 AM 03/11/2021    2:43 PM 03/01/2021    8:13 AM 04/12/2019    8:32 AM 10/21/2018   10:05 AM  PHQ 2/9 Scores  PHQ - 2 Score 0 0 0 0 0 0 0  PHQ- 9 Score   0        Fall Risk    03/04/2022    8:24 AM 08/20/2021   10:35 AM 05/31/2021    9:02 AM 03/11/2021    2:42 PM 03/01/2021    8:18 AM  [place] in the past year? 0 0 0 0 0  Number falls in past yr: 0    0  Injury with Fall? 0    0  Risk for fall due to : No Fall Risks    No Fall Risks  Follow up Falls prevention discussed    Falls prevention discussed    FALL RISK PREVENTION PERTAINING TO THE HOME:  Any stairs in or around the home? Yes  If so, are there any without handrails? No  Home free of loose throw rugs in walkways, pet beds, electrical cords, etc? Yes  Adequate lighting in your home to reduce risk of falls? Yes   ASSISTIVE DEVICES UTILIZED TO PREVENT FALLS:  Life alert? No  Use of a cane,  walker or w/c? No  Grab bars in the bathroom? No  Shower chair or bench in shower? No  Elevated toilet seat or a handicapped toilet? No       04/09/2018    8:53 AM 07/21/2016    4:07 PM  MMSE - Mini Mental State Exam  Orientation to time 5 5  Orientation to Place 5 5  Registration 3 3  Attention/ Calculation 5 5  Recall 3 3  Language- name 2 objects 2 2  Language- repeat 1 1  Language- follow 3 step command 3 3  Language- read & follow direction 1 1  Write a sentence 1 1  Copy design 1 1  Total score 30 30        03/04/2022    8:27 AM 03/01/2021    8:19 AM 04/12/2019    8:29 AM  6CIT Screen  What Year? 0 points 0 points 0 points  What month? 0 points 0 points 0 points  What time? 0 points 0 points 0 points  Count back from 20 0 points 0 points 0 points  Months in reverse 0 points 0 points 0 points  Repeat phrase 0 points 0 points 0 points  Total Score 0 points 0 points 0 points    Immunizations  There is no immunization history on file for this patient.  TDAP status: Due, Education has been provided regarding the importance of this vaccine. Advised may receive this vaccine at local pharmacy or Health Dept. Aware to provide a copy of the vaccination record if obtained from local pharmacy or Health Dept. Verbalized acceptance and understanding.  Flu Vaccine status: Declined, Education has been provided regarding the importance of  this vaccine but patient still declined. Advised may receive this vaccine at local pharmacy or Health Dept. Aware to provide a copy of the vaccination record if obtained from local pharmacy or Health Dept. Verbalized acceptance and understanding.  Pneumococcal vaccine status: Due, Education has been provided regarding the importance of this vaccine. Advised may receive this vaccine at local pharmacy or Health Dept. Aware to provide a copy of the vaccination record if obtained from local pharmacy or Health Dept. Verbalized acceptance and  understanding.  Covid-19 vaccine status: Declined, Education has been provided regarding the importance of this vaccine but patient still declined. Advised may receive this vaccine at local pharmacy or Health Dept.or vaccine clinic. Aware to provide a copy of the vaccination record if obtained from local pharmacy or Health Dept. Verbalized acceptance and understanding.  Qualifies for Shingles Vaccine? Yes   Zostavax completed No   Shingrix Completed?: No.    Education has been provided regarding the importance of this vaccine. Patient has been advised to call insurance company to determine out of pocket expense if they have not yet received this vaccine. Advised may also receive vaccine at local pharmacy or Health Dept. Verbalized acceptance and understanding.  Screening Tests Health Maintenance  Topic Date Due   COVID-19 Vaccine (1) Never done   DTaP/Tdap/Td (1 - Tdap) Never done   Zoster Vaccines- Shingrix (1 of 2) Never done   INFLUENZA VACCINE  Never done   Pneumonia Vaccine 47+ Years old (1 - PCV) 03/11/2022 (Originally 04/12/2009)   Medicare Annual Wellness (AWV)  03/05/2023   DEXA SCAN  05/07/2023   Hepatitis C Screening  Completed   HPV VACCINES  Aged Out    Health Maintenance  Health Maintenance Due  Topic Date Due   COVID-19 Vaccine (1) Never done   DTaP/Tdap/Td (1 - Tdap) Never done   Zoster Vaccines- Shingrix (1 of 2) Never done   INFLUENZA VACCINE  Never done    Colorectal cancer screening: No longer required.   Mammogram status: No longer required due to age .  Bone Density status: Completed 05/06/2021. Results reflect: Bone density results: OSTEOPENIA. Repeat every 5 years.  Lung Cancer Screening: (Low Dose CT Chest recommended if Age 57-80 years, 30 pack-year currently smoking OR have quit w/in 15years.) does not qualify.   Lung Cancer Screening Referral: n/a  Additional Screening:  Hepatitis C Screening: does not qualify;   Vision Screening: Recommended  annual ophthalmology exams for early detection of glaucoma and other disorders of the eye. Is the patient up to date with their annual eye exam?  Yes  Who is the provider or what is the name of the office in which the patient attends annual eye exams? Dr.Miller  If pt is not established with a provider, would they like to be referred to a provider to establish care? No .   Dental Screening: Recommended annual dental exams for proper oral hygiene  Community Resource Referral / Chronic Care Management: CRR required this visit?  No   CCM required this visit?  No      Plan:     I have personally reviewed and noted the following in the patient's chart:   Medical and social history Use of alcohol, tobacco or illicit drugs  Current medications and supplements including opioid prescriptions. Patient is not currently taking opioid prescriptions. Functional ability and status Nutritional status Physical activity Advanced directives List of other physicians Hospitalizations, surgeries, and ER visits in previous 12 months Vitals Screenings to include cognitive,  depression, and falls Referrals and appointments  In addition, I have reviewed and discussed with patient certain preventive protocols, quality metrics, and best practice recommendations. A written personalized care plan for preventive services as well as general preventive health recommendations were provided to patient.     Daphane Shepherd, LPN   03/29/8248   Nurse Notes: Declines all Vaccines

## 2022-03-11 ENCOUNTER — Encounter: Payer: Self-pay | Admitting: Family

## 2022-03-11 ENCOUNTER — Ambulatory Visit (INDEPENDENT_AMBULATORY_CARE_PROVIDER_SITE_OTHER): Payer: PPO | Admitting: Family

## 2022-03-11 VITALS — BP 136/73 | HR 64 | Temp 97.2°F | Ht 62.0 in | Wt 143.0 lb

## 2022-03-11 DIAGNOSIS — M8580 Other specified disorders of bone density and structure, unspecified site: Secondary | ICD-10-CM | POA: Diagnosis not present

## 2022-03-11 DIAGNOSIS — E559 Vitamin D deficiency, unspecified: Secondary | ICD-10-CM

## 2022-03-11 DIAGNOSIS — Z0001 Encounter for general adult medical examination with abnormal findings: Secondary | ICD-10-CM

## 2022-03-11 DIAGNOSIS — M5432 Sciatica, left side: Secondary | ICD-10-CM

## 2022-03-11 DIAGNOSIS — E663 Overweight: Secondary | ICD-10-CM

## 2022-03-11 DIAGNOSIS — Z0000 Encounter for general adult medical examination without abnormal findings: Secondary | ICD-10-CM

## 2022-03-11 DIAGNOSIS — E785 Hyperlipidemia, unspecified: Secondary | ICD-10-CM | POA: Diagnosis not present

## 2022-03-11 DIAGNOSIS — M5431 Sciatica, right side: Secondary | ICD-10-CM | POA: Insufficient documentation

## 2022-03-11 MED ORDER — GABAPENTIN 300 MG PO CAPS: 300.0000 mg | ORAL_CAPSULE | Freq: Every day | ORAL | 2 refills | Status: DC

## 2022-03-11 NOTE — Patient Instructions (Signed)
Health Maintenance After Age 78 After age 78, you are at a higher risk for certain long-term diseases and infections as well as injuries from falls. Falls are a major cause of broken bones and head injuries in people who are older than age 78. Getting regular preventive care can help to keep you healthy and well. Preventive care includes getting regular testing and making lifestyle changes as recommended by your health care provider. Talk with your health care provider about: Which screenings and tests you should have. A screening is a test that checks for a disease when you have no symptoms. A diet and exercise plan that is right for you. What should I know about screenings and tests to prevent falls? Screening and testing are the best ways to find a health problem early. Early diagnosis and treatment give you the best chance of managing medical conditions that are common after age 78. Certain conditions and lifestyle choices may make you more likely to have a fall. Your health care provider may recommend: Regular vision checks. Poor vision and conditions such as cataracts can make you more likely to have a fall. If you wear glasses, make sure to get your prescription updated if your vision changes. Medicine review. Work with your health care provider to regularly review all of the medicines you are taking, including over-the-counter medicines. Ask your health care provider about any side effects that may make you more likely to have a fall. Tell your health care provider if any medicines that you take make you feel dizzy or sleepy. Strength and balance checks. Your health care provider may recommend certain tests to check your strength and balance while standing, walking, or changing positions. Foot health exam. Foot pain and numbness, as well as not wearing proper footwear, can make you more likely to have a fall. Screenings, including: Osteoporosis screening. Osteoporosis is a condition that causes  the bones to get weaker and break more easily. Blood pressure screening. Blood pressure changes and medicines to control blood pressure can make you feel dizzy. Depression screening. You may be more likely to have a fall if you have a fear of falling, feel depressed, or feel unable to do activities that you used to do. Alcohol use screening. Using too much alcohol can affect your balance and may make you more likely to have a fall. Follow these instructions at home: Lifestyle Do not drink alcohol if: Your health care provider tells you not to drink. If you drink alcohol: Limit how much you have to: 0-1 drink a day for women. 0-2 drinks a day for men. Know how much alcohol is in your drink. In the [place] one drink equals one 12 oz bottle of beer (355 mL), one 5 oz glass of wine (148 mL), or one 1 oz glass of hard liquor (44 mL). Do not use any products that contain nicotine or tobacco. These products include cigarettes, chewing tobacco, and vaping devices, such as e-cigarettes. If you need help quitting, ask your health care provider. Activity  Follow a regular exercise program to stay fit. This will help you maintain your balance. Ask your health care provider what types of exercise are appropriate for you. If you need a cane or walker, use it as recommended by your health care provider. Wear supportive shoes that have nonskid soles. Safety  Remove any tripping hazards, such as rugs, cords, and clutter. Install safety equipment such as grab bars in bathrooms and safety rails on stairs. Keep rooms and walkways   well-lit. General instructions Talk with your health care provider about your risks for falling. Tell your health care provider if: You fall. Be sure to tell your health care provider about all falls, even ones that seem minor. You feel dizzy, tiredness (fatigue), or off-balance. Take over-the-counter and prescription medicines only as told by your health care provider. These include  supplements. Eat a healthy diet and maintain a healthy weight. A healthy diet includes low-fat dairy products, low-fat (lean) meats, and fiber from whole grains, beans, and lots of fruits and vegetables. Stay current with your vaccines. Schedule regular health, dental, and eye exams. Summary Having a healthy lifestyle and getting preventive care can help to protect your health and wellness after age 78. Screening and testing are the best way to find a health problem early and help you avoid having a fall. Early diagnosis and treatment give you the best chance for managing medical conditions that are more common for people who are older than age 78. Falls are a major cause of broken bones and head injuries in people who are older than age 78. Take precautions to prevent a fall at home. Work with your health care provider to learn what changes you can make to improve your health and wellness and to prevent falls. This information is not intended to replace advice given to you by your health care provider. Make sure you discuss any questions you have with your health care provider. Document Revised: 06/11/2020 Document Reviewed: 06/11/2020 Elsevier Patient Education  2023 Elsevier Inc.  

## 2022-03-11 NOTE — Progress Notes (Signed)
Subjective:    Patient ID: Krista Price, female    DOB: [birth year], 77 y.o.   MRN: [id number]  Chief Complaint  Patient presents with   Annual Exam   PT presents to the office today for CPE. She is currently taking a multivitamin. Pt denies any headache, palpitations, SOB, or edema at this time.     She has chronic sciatic pain and takes Gabapentin 300 mg nightly.   She has osteopenia and takes calcium and vit D. States she does weight bearing exercises 6 times a week. Doing one hour strength class and aerobics classes at the [facility]. Back Pain This is a chronic problem. The current episode started more than 1 year ago. The problem occurs intermittently. The problem has been waxing and waning since onset. The pain is present in the lumbar spine. The quality of the pain is described as aching. The pain is at a severity of 1/10. The pain is mild. Associated symptoms include leg pain and weakness. She has tried home exercises for the symptoms. The treatment provided mild relief.  Hyperlipidemia This is a chronic problem. The current episode started more than 1 year ago. The problem is uncontrolled. Associated symptoms include leg pain. Current antihyperlipidemic treatment includes diet change. The current treatment provides mild improvement of lipids. Risk factors for coronary artery disease include dyslipidemia, a sedentary lifestyle and post-menopausal.      Review of Systems  Musculoskeletal:  Positive for back pain.  Neurological:  Positive for weakness.  All other systems reviewed and are negative.  Family History  Problem Relation Age of Onset   Cancer Mother        breast   Cancer Father        Lung   Heart disease Father    Diabetes Brother    Hyperlipidemia Daughter    Heart disease Maternal Grandfather    Heart disease Paternal Grandfather    Social History   Socioeconomic History   Marital status: Widowed    Spouse name: Not on file   Number of children: 2    Years of education: 12   Highest education level: High school graduate  Occupational History   Occupation: Retired    Comment: [employer]   Tobacco Use   Smoking status: Never   Smokeless tobacco: Never  Vaping Use   Vaping Use: Never used  Substance and Sexual Activity   Alcohol use: No    Alcohol/week: 0.0 standard drinks of alcohol   Drug use: No   Sexual activity: Not Currently  Other Topics Concern   Not on file  Social History Narrative   Lives alone - has a basement   Son in [place]   Social Determinants of Health   Financial Resource Strain: Low Risk  (03/04/2022)   Overall Financial Resource Strain (CARDIA)    Difficulty of Paying Living Expenses: Not hard at all  Food Insecurity: No Food Insecurity (03/04/2022)   Hunger Vital Sign    Worried About Running Out of Food in the Last Year: Never true    [place] in the Last Year: Never true  Transportation Needs: No Transportation Needs (03/04/2022)   PRAPARE - Hydrologist (Medical): No    Lack of Transportation (Non-Medical): No  Physical Activity: Sufficiently Active (03/04/2022)   Exercise Vital Sign    Days of Exercise per Week: 6 days    Minutes of Exercise per Session: 30 min  Stress: No  Stress Concern Present (03/04/2022)   [place]    Feeling of Stress : Not at all  Social Connections: Moderately Integrated (03/04/2022)   Social Connection and Isolation Panel [NHANES]    Frequency of Communication with Friends and Family: More than three times a week    Frequency of Social Gatherings with Friends and Family: More than three times a week    Attends Religious Services: More than 4 times per year    Active Member of [employer] or Organizations: Yes    Attends Archivist Meetings: More than 4 times per year    Marital Status: Widowed       Objective:   Physical Exam Vitals reviewed.   Constitutional:      General: She is not in acute distress.    Appearance: She is well-developed.  HENT:     Head: Normocephalic and atraumatic.     Right Ear: Tympanic membrane normal.     Left Ear: Tympanic membrane normal.  Eyes:     Pupils: Pupils are equal, round, and reactive to light.  Neck:     Thyroid: No thyromegaly.  Cardiovascular:     Rate and Rhythm: Normal rate and regular rhythm.     Heart sounds: Normal heart sounds. No murmur heard. Pulmonary:     Effort: Pulmonary effort is normal. No respiratory distress.     Breath sounds: Normal breath sounds. No wheezing.  Abdominal:     General: Bowel sounds are normal. There is no distension.     Palpations: Abdomen is soft.     Tenderness: There is no abdominal tenderness.  Musculoskeletal:        General: No tenderness. Normal range of motion.     Cervical back: Normal range of motion and neck supple.  Skin:    General: Skin is warm and dry.  Neurological:     Mental Status: She is alert and oriented to person, place, and time.     Cranial Nerves: No cranial nerve deficit.     Deep Tendon Reflexes: Reflexes are normal and symmetric.  Psychiatric:        Behavior: Behavior normal.        Thought Content: Thought content normal.        Judgment: Judgment normal.         BP 136/73   Pulse 64   Temp (!) 97.2 F (36.2 C) (Temporal)   Ht '5\' 2"'$  (1.575 m)   Wt 143 lb (64.9 kg)   SpO2 97%   BMI 26.16 kg/m   Assessment & Plan:  Krista Price comes in today with chief complaint of Annual Exam   Diagnosis and orders addressed:  1. Annual physical exam - CMP14+EGFR - CBC with Differential/Platelet - Lipid panel - TSH  2. Hyperlipidemia, unspecified hyperlipidemia type  - CMP14+EGFR - CBC with Differential/Platelet  3. Osteopenia, unspecified location - CMP14+EGFR - CBC with Differential/Platelet  4. Overweight (BMI 25.0-29.9)  - CMP14+EGFR - CBC with Differential/Platelet  5. Vitamin D  deficiency  - CMP14+EGFR - CBC with Differential/Platelet  6. Bilateral sciatica - gabapentin (NEURONTIN) 300 MG capsule; Take 1 capsule (300 mg total) by mouth at bedtime.  Dispense: 90 capsule; Refill: 2   Labs pending Health Maintenance reviewed Diet and exercise encouraged  Follow up plan: 1 year   Evelina Dun, FNP

## 2022-03-12 ENCOUNTER — Encounter: Payer: PPO | Admitting: Family

## 2022-03-12 LAB — CBC WITH DIFFERENTIAL/PLATELET
Basophils Absolute: 0.1 10*3/uL (ref 0.0–0.2)
Basos: 1 %
EOS (ABSOLUTE): 0.1 10*3/uL (ref 0.0–0.4)
Eos: 1 %
Hematocrit: 38.3 % (ref 34.0–46.6)
Hemoglobin: 13.8 g/dL (ref 11.1–15.9)
Immature Grans (Abs): 0 10*3/uL (ref 0.0–0.1)
Immature Granulocytes: 1 %
Lymphocytes Absolute: 1.1 10*3/uL (ref 0.7–3.1)
Lymphs: 23 %
MCH: 33 pg (ref 26.6–33.0)
MCHC: 36 g/dL — ABNORMAL HIGH (ref 31.5–35.7)
MCV: 92 fL (ref 79–97)
Monocytes Absolute: 0.3 10*3/uL (ref 0.1–0.9)
Monocytes: 7 %
Neutrophils Absolute: 3.3 10*3/uL (ref 1.4–7.0)
Neutrophils: 67 %
Platelets: 202 10*3/uL (ref 150–450)
RBC: 4.18 x10E6/uL (ref 3.77–5.28)
RDW: 12.9 % (ref 11.7–15.4)
WBC: 4.9 10*3/uL (ref 3.4–10.8)

## 2022-03-12 LAB — CMP14+EGFR
ALT: 18 IU/L (ref 0–32)
AST: 18 IU/L (ref 0–40)
Albumin/Globulin Ratio: 2.1 (ref 1.2–2.2)
Albumin: 4.5 g/dL (ref 3.8–4.8)
Alkaline Phosphatase: 78 IU/L (ref 44–121)
BUN/Creatinine Ratio: 22 (ref 12–28)
BUN: 18 mg/dL (ref 8–27)
Bilirubin Total: 0.6 mg/dL (ref 0.0–1.2)
CO2: 23 mmol/L (ref 20–29)
Calcium: 9.7 mg/dL (ref 8.7–10.3)
Chloride: 104 mmol/L (ref 96–106)
Creatinine, Ser: 0.83 mg/dL (ref 0.57–1.00)
Globulin, Total: 2.1 g/dL (ref 1.5–4.5)
Glucose: 77 mg/dL (ref 70–99)
Potassium: 4.8 mmol/L (ref 3.5–5.2)
Sodium: 143 mmol/L (ref 134–144)
Total Protein: 6.6 g/dL (ref 6.0–8.5)
eGFR: 73 mL/min/{1.73_m2} (ref >59)

## 2022-03-12 LAB — LIPID PANEL
Chol/HDL Ratio: 3.8 ratio (ref 0.0–4.4)
Cholesterol, Total: 233 mg/dL — ABNORMAL HIGH (ref 100–199)
HDL: 62 mg/dL (ref >39)
LDL Chol Calc (NIH): 146 mg/dL — ABNORMAL HIGH (ref 0–99)
Triglycerides: 143 mg/dL (ref 0–149)
VLDL Cholesterol Cal: 25 mg/dL (ref 5–40)

## 2022-03-12 LAB — TSH: TSH: 1.92 u[IU]/mL (ref 0.450–4.500)

## 2022-03-13 ENCOUNTER — Other Ambulatory Visit: Payer: Self-pay | Admitting: Family

## 2022-03-13 MED ORDER — ROSUVASTATIN CALCIUM 5 MG PO TABS: 5.0000 mg | ORAL_TABLET | Freq: Every day | ORAL | 3 refills | Status: DC

## 2023-03-02 DIAGNOSIS — Z1231 Encounter for screening mammogram for malignant neoplasm of breast: Secondary | ICD-10-CM | POA: Diagnosis not present

## 2023-03-24 ENCOUNTER — Other Ambulatory Visit: Payer: Self-pay | Admitting: Family

## 2023-03-24 DIAGNOSIS — M5432 Sciatica, left side: Secondary | ICD-10-CM

## 2023-03-24 NOTE — Telephone Encounter (Signed)
Christy NTBS last OV 03/11/22 NO RF sent to pharmacy last OV greater than a year

## 2023-03-24 NOTE — Telephone Encounter (Signed)
Made appt march 4

## 2023-03-24 NOTE — Addendum Note (Signed)
Addended by: Julious Payer D on: 03/24/2023 03:18 PM   Modules accepted: Orders

## 2023-03-26 ENCOUNTER — Other Ambulatory Visit: Payer: Self-pay | Admitting: Family

## 2023-03-26 DIAGNOSIS — M5431 Sciatica, right side: Secondary | ICD-10-CM

## 2023-03-26 MED ORDER — GABAPENTIN 300 MG PO CAPS: 300.0000 mg | ORAL_CAPSULE | Freq: Every day | ORAL | 0 refills | Status: DC

## 2023-03-26 NOTE — Telephone Encounter (Signed)
Copied from CRM [id number]. Topic: Clinical - Medication Refill >> Mar 26, 2023  8:47 AM Gildardo Pounds wrote: Most Recent Primary Care Visit:  Provider: Jannifer Rodney A  Department: [facility]  Visit Type: PHYSICAL  Date: 03/11/2022  Medication: gabapentin (NEURONTIN) 300 MG capsule  Has the patient contacted their pharmacy? Yes (Agent: If no, request that the patient contact the pharmacy for the refill. If patient does not wish to contact the pharmacy document the reason why and proceed with request.) (Agent: If yes, when and what did the pharmacy advise?)  Is this the correct pharmacy for this prescription? Yes If no, delete pharmacy and type the correct one.  This is the patient's preferred pharmacy:  [facility] [address] [place] - [address] [place] [address] [place] [address] Phone: [phone] Fax: [phone]   Has the prescription been filled recently? No  Is the patient out of the medication? No  Has the patient been seen for an appointment in the last year OR does the patient have an upcoming appointment? Yes  Can we respond through MyChart? Yes  Agent: Please be advised that Rx refills may take up to 3 business days. We ask that you follow-up with your pharmacy.

## 2023-04-07 ENCOUNTER — Ambulatory Visit (INDEPENDENT_AMBULATORY_CARE_PROVIDER_SITE_OTHER): Payer: PPO | Admitting: Family

## 2023-04-07 ENCOUNTER — Encounter: Payer: Self-pay | Admitting: Family

## 2023-04-07 VITALS — BP 132/66 | HR 76 | Temp 97.5°F | Ht 62.0 in | Wt 142.0 lb

## 2023-04-07 DIAGNOSIS — E663 Overweight: Secondary | ICD-10-CM

## 2023-04-07 DIAGNOSIS — Z0000 Encounter for general adult medical examination without abnormal findings: Secondary | ICD-10-CM | POA: Diagnosis not present

## 2023-04-07 DIAGNOSIS — E785 Hyperlipidemia, unspecified: Secondary | ICD-10-CM

## 2023-04-07 DIAGNOSIS — E559 Vitamin D deficiency, unspecified: Secondary | ICD-10-CM

## 2023-04-07 DIAGNOSIS — M5431 Sciatica, right side: Secondary | ICD-10-CM | POA: Diagnosis not present

## 2023-04-07 DIAGNOSIS — Z0001 Encounter for general adult medical examination with abnormal findings: Secondary | ICD-10-CM

## 2023-04-07 DIAGNOSIS — Z6825 Body mass index (BMI) 25.0-25.9, adult: Secondary | ICD-10-CM

## 2023-04-07 DIAGNOSIS — M8580 Other specified disorders of bone density and structure, unspecified site: Secondary | ICD-10-CM

## 2023-04-07 DIAGNOSIS — M5432 Sciatica, left side: Secondary | ICD-10-CM | POA: Diagnosis not present

## 2023-04-07 MED ORDER — GABAPENTIN 300 MG PO CAPS: 300.0000 mg | ORAL_CAPSULE | Freq: Every day | ORAL | 4 refills | Status: AC

## 2023-04-07 NOTE — Patient Instructions (Signed)

## 2023-04-07 NOTE — Progress Notes (Signed)
 Subjective:    Patient ID: Krista Price, female    DOB: 1945-07-25, 78 y.o.   MRN: [id number]  Chief Complaint  Patient presents with   Medical Management of Chronic Issues   PT presents to the office today for CPE. She is currently taking a multivitamin. Pt denies any headache, palpitations, SOB, or edema at this time.     She has chronic sciatic pain and takes Gabapentin 300 mg nightly.   She has osteopenia and takes calcium and vit D. States she does weight bearing exercises 6 times a week. Doing one hour strength classes at the [facility]. Back Pain This is a chronic problem. The current episode started more than 1 year ago. The problem occurs intermittently. The problem has been waxing and waning since onset. The pain is present in the lumbar spine. The quality of the pain is described as aching. The pain is at a severity of 1/10. The pain is mild. Associated symptoms include leg pain and weakness. She has tried home exercises for the symptoms. The treatment provided mild relief.  Hyperlipidemia This is a chronic problem. The current episode started more than 1 year ago. The problem is uncontrolled. Associated symptoms include leg pain. Current antihyperlipidemic treatment includes diet change. The current treatment provides mild improvement of lipids. Risk factors for coronary artery disease include dyslipidemia, a sedentary lifestyle and post-menopausal.      Review of Systems  Musculoskeletal:  Positive for back pain.  Neurological:  Positive for weakness.  All other systems reviewed and are negative.  Family History  Problem Relation Age of Onset   Cancer Mother        breast   Cancer Father        Lung   Heart disease Father    Diabetes Brother    Hyperlipidemia Daughter    Heart disease Maternal Grandfather    Heart disease Paternal Grandfather    Social History   Socioeconomic History   Marital status: Widowed    Spouse name: Not on file   Number of  children: 2   Years of education: 30   Highest education level: 12th grade  Occupational History   Occupation: Retired    Comment: [employer]   Tobacco Use   Smoking status: Never   Smokeless tobacco: Never  Vaping Use   Vaping status: Never Used  Substance and Sexual Activity   Alcohol use: No    Alcohol/week: 0.0 standard drinks of alcohol   Drug use: No   Sexual activity: Not Currently  Other Topics Concern   Not on file  Social History Narrative   Lives alone - has a basement   Son in [place]   Social Drivers of Health   Financial Resource Strain: Low Risk  (04/03/2023)   Overall Financial Resource Strain (CARDIA)    Difficulty of Paying Living Expenses: Not hard at all  Food Insecurity: No Food Insecurity (04/03/2023)   Hunger Vital Sign    Worried About Running Out of Food in the Last Year: Never true    Ran Out of Food in the Last Year: Never true  Transportation Needs: No Transportation Needs (04/03/2023)   PRAPARE - Administrator, Civil Service (Medical): No    Lack of Transportation (Non-Medical): No  Physical Activity: Sufficiently Active (04/03/2023)   Exercise Vital Sign    Days of Exercise per Week: 6 days    Minutes of Exercise per Session: 90 min  Stress: No  Stress Concern Present (04/03/2023)   [employer] of Occupational Health - Occupational Stress Questionnaire    Feeling of Stress : Not at all  Social Connections: Moderately Integrated (04/03/2023)   Social Connection and Isolation Panel [NHANES]    Frequency of Communication with Friends and Family: Three times a week    Frequency of Social Gatherings with Friends and Family: More than three times a week    Attends Religious Services: More than 4 times per year    Active Member of Clubs or Organizations: Yes    Attends Banker Meetings: More than 4 times per year    Marital Status: Widowed       Objective:   Physical Exam Vitals reviewed.   Constitutional:      General: She is not in acute distress.    Appearance: She is well-developed.  HENT:     Head: Normocephalic and atraumatic.     Right Ear: Tympanic membrane normal.     Left Ear: Tympanic membrane normal.  Eyes:     Pupils: Pupils are equal, round, and reactive to light.  Neck:     Thyroid: No thyromegaly.  Cardiovascular:     Rate and Rhythm: Normal rate and regular rhythm.     Heart sounds: Normal heart sounds. No murmur heard. Pulmonary:     Effort: Pulmonary effort is normal. No respiratory distress.     Breath sounds: Normal breath sounds. No wheezing.  Abdominal:     General: Bowel sounds are normal. There is no distension.     Palpations: Abdomen is soft.     Tenderness: There is no abdominal tenderness.  Musculoskeletal:        General: No tenderness. Normal range of motion.     Cervical back: Normal range of motion and neck supple.  Skin:    General: Skin is warm and dry.  Neurological:     Mental Status: She is alert and oriented to person, place, and time.     Cranial Nerves: No cranial nerve deficit.     Deep Tendon Reflexes: Reflexes are normal and symmetric.  Psychiatric:        Behavior: Behavior normal.        Thought Content: Thought content normal.        Judgment: Judgment normal.         BP 132/66   Pulse 76   Temp (!) 97.5 F (36.4 C) (Temporal)   Ht 5\' 2"  (1.575 m)   Wt 142 lb (64.4 kg)   SpO2 96%   BMI 25.97 kg/m   Assessment & Plan:  Krista Price comes in today with chief complaint of Medical Management of Chronic Issues   Diagnosis and orders addressed:  1. Bilateral sciatica - gabapentin (NEURONTIN) 300 MG capsule; Take 1 capsule (300 mg total) by mouth at bedtime.  Dispense: 90 capsule; Refill: 4 - CMP14+EGFR - CBC with Differential/Platelet  2. Annual physical exam (Primary) - CMP14+EGFR - CBC with Differential/Platelet - Lipid panel - VITAMIN D 25 Hydroxy (Vit-D Deficiency, Fractures)  3.  Osteopenia, unspecified location - CMP14+EGFR - CBC with Differential/Platelet  4. Vitamin D deficiency - CMP14+EGFR - CBC with Differential/Platelet - VITAMIN D 25 Hydroxy (Vit-D Deficiency, Fractures)  5. Hyperlipidemia, unspecified hyperlipidemia type - CMP14+EGFR - CBC with Differential/Platelet - Lipid panel  6. Overweight (BMI 25.0-29.9) - CMP14+EGFR - CBC with Differential/Platelet   Labs pending Continue current medication Continue exercise routine  Health Maintenance reviewed Diet and exercise encouraged  Follow up plan: 1 year   Jannifer Rodney, FNP

## 2023-04-08 LAB — CBC WITH DIFFERENTIAL/PLATELET
Basophils Absolute: 0 x10E3/uL (ref 0.0–0.2)
Basos: 1 %
EOS (ABSOLUTE): 0.1 x10E3/uL (ref 0.0–0.4)
Eos: 2 %
Hematocrit: 38 % (ref 34.0–46.6)
Hemoglobin: 13.3 g/dL (ref 11.1–15.9)
Immature Grans (Abs): 0 x10E3/uL (ref 0.0–0.1)
Immature Granulocytes: 0 %
Lymphocytes Absolute: 1.2 x10E3/uL (ref 0.7–3.1)
Lymphs: 20 %
MCH: 32.4 pg (ref 26.6–33.0)
MCHC: 35 g/dL (ref 31.5–35.7)
MCV: 93 fL (ref 79–97)
Monocytes Absolute: 0.4 x10E3/uL (ref 0.1–0.9)
Monocytes: 6 %
Neutrophils Absolute: 4.4 x10E3/uL (ref 1.4–7.0)
Neutrophils: 71 %
Platelets: 232 x10E3/uL (ref 150–450)
RBC: 4.11 x10E6/uL (ref 3.77–5.28)
RDW: 13.1 % (ref 11.7–15.4)
WBC: 6.2 x10E3/uL (ref 3.4–10.8)

## 2023-04-08 LAB — LIPID PANEL
Chol/HDL Ratio: 4 ratio (ref 0.0–4.4)
Cholesterol, Total: 226 mg/dL — ABNORMAL HIGH (ref 100–199)
HDL: 57 mg/dL (ref >39)
LDL Chol Calc (NIH): 138 mg/dL — ABNORMAL HIGH (ref 0–99)
Triglycerides: 174 mg/dL — ABNORMAL HIGH (ref 0–149)
VLDL Cholesterol Cal: 31 mg/dL (ref 5–40)

## 2023-04-08 LAB — CMP14+EGFR
ALT: 29 IU/L (ref 0–32)
AST: 33 IU/L (ref 0–40)
Albumin: 4.3 g/dL (ref 3.8–4.8)
Alkaline Phosphatase: 81 IU/L (ref 44–121)
BUN/Creatinine Ratio: 23 (ref 12–28)
BUN: 19 mg/dL (ref 8–27)
Bilirubin Total: 0.4 mg/dL (ref 0.0–1.2)
CO2: 22 mmol/L (ref 20–29)
Calcium: 9.5 mg/dL (ref 8.7–10.3)
Chloride: 106 mmol/L (ref 96–106)
Creatinine, Ser: 0.83 mg/dL (ref 0.57–1.00)
Globulin, Total: 2.1 g/dL (ref 1.5–4.5)
Glucose: 81 mg/dL (ref 70–99)
Potassium: 4.4 mmol/L (ref 3.5–5.2)
Sodium: 142 mmol/L (ref 134–144)
Total Protein: 6.4 g/dL (ref 6.0–8.5)
eGFR: 72 mL/min/1.73

## 2023-04-08 LAB — VITAMIN D 25 HYDROXY (VIT D DEFICIENCY, FRACTURES): Vit D, 25-Hydroxy: 47.4 ng/mL (ref 30.0–100.0)

## 2023-04-10 ENCOUNTER — Other Ambulatory Visit: Payer: Self-pay | Admitting: Family

## 2023-06-01 DIAGNOSIS — M533 Sacrococcygeal disorders, not elsewhere classified: Secondary | ICD-10-CM | POA: Diagnosis not present

## 2023-06-26 ENCOUNTER — Telehealth: Payer: Self-pay | Admitting: Family

## 2023-06-26 NOTE — Telephone Encounter (Signed)
 Copied from CRM [id number]. Topic: Clinical - Medication Refill >> Jun 26, 2023  9:22 AM Blair Bumpers wrote: Medication: gabapentin  (NEURONTIN ) 300 MG capsule  Has the patient contacted their pharmacy? No (Agent: If no, request that the patient contact the pharmacy for the refill. If patient does not wish to contact the pharmacy document the reason why and proceed with request.) (Agent: If yes, when and what did the pharmacy advise?)  This is the patient's preferred pharmacy:  [id number] - [place], [place] - [address] [place] [address] Phone: [phone] Fax: [phone]  Is this the correct pharmacy for this prescription? Yes If no, delete pharmacy and type the correct one.   Has the prescription been filled recently? Yes  Is the patient out of the medication? Yes  Has the patient been seen for an appointment in the last year OR does the patient have an upcoming appointment? Yes  Can we respond through MyChart? Yes  Agent: Please be advised that Rx refills may take up to 3 business days. We ask that you follow-up with your pharmacy.

## 2023-06-26 NOTE — Telephone Encounter (Signed)
 [facility] pharmacy and confirmed patient received 90 day supply with refills in March. Pharmacy states they will manage her refill request. Closing encounter.

## 2023-06-30 DIAGNOSIS — M533 Sacrococcygeal disorders, not elsewhere classified: Secondary | ICD-10-CM | POA: Diagnosis not present

## 2023-07-14 ENCOUNTER — Ambulatory Visit

## 2023-07-14 VITALS — BP 132/66 | HR 76 | Ht 62.0 in | Wt 142.0 lb

## 2023-07-14 DIAGNOSIS — Z0000 Encounter for general adult medical examination without abnormal findings: Secondary | ICD-10-CM

## 2023-07-14 DIAGNOSIS — Z13820 Encounter for screening for osteoporosis: Secondary | ICD-10-CM

## 2023-07-14 NOTE — Patient Instructions (Signed)
 Ms. Krista Price , Thank you for taking time out of your busy schedule to complete your Annual Wellness Visit with me. I enjoyed our conversation and look forward to speaking with you again next year. I, as well as your care team,  appreciate your ongoing commitment to your health goals. Please review the following plan we discussed and let me know if I can assist you in the future. Your Game plan/ To Do List    Follow up Visits: Next Medicare AWV with our clinical staff: 07/14/24 at 10:00a.m.   Next Office Visit with your provider: 04/08/24 at 11:25a.m.  Clinician Recommendations:  Aim for 30 minutes of exercise or brisk walking, 6-8 glasses of water, and 5 servings of fruits and vegetables each day. Please remember to make your appointment for your Dex scan (bone density) as soon as you can.       This is a list of the screening recommended for you and due dates:  Health Maintenance  Topic Date Due   Zoster (Shingles) Vaccine (1 of 2) Never done   DEXA scan (bone density measurement)  05/07/2023   DTaP/Tdap/Td vaccine (1 - Tdap) 04/06/2024*   Pneumonia Vaccine (1 of 1 - PCV) 04/06/2024*   COVID-19 Vaccine (1 - 2024-25 season) 07/29/2024*   Flu Shot  09/04/2023   Medicare Annual Wellness Visit  07/13/2024   Hepatitis C Screening  Completed   HPV Vaccine  Aged Out   Meningitis B Vaccine  Aged Out  *Topic was postponed. The date shown is not the original due date.    Advanced directives: (Declined) Advance directive discussed with you today. Even though you declined this today, please call our office should you change your mind, and we can give you the proper paperwork for you to fill out. Advance Care Planning is important because it:  [x]  Makes sure you receive the medical care that is consistent with your values, goals, and preferences  [x]  It provides guidance to your family and loved ones and reduces their decisional burden about whether or not they are making the right decisions based on  your wishes.  Follow the link provided in your after visit summary or read over the paperwork we have mailed to you to help you started getting your Advance Directives in place. If you need assistance in completing these, please reach out to us  so that we can help you!  See attachments for Preventive Care and Fall Prevention Tips.

## 2023-07-14 NOTE — Progress Notes (Signed)
 Subjective:   Krista Price is a 78 y.o. who presents for a Medicare Wellness preventive visit.  As a reminder, Annual Wellness Visits don't include a physical exam, and some assessments may be limited, especially if this visit is performed virtually. We may recommend an in-person follow-up visit with your provider if needed.  Visit Complete: Virtual I connected with  Krista Price on 07/14/23 by a audio enabled telemedicine application and verified that I am speaking with the correct person using two identifiers.  Patient Location: Home  Provider Location: Home Office  I discussed the limitations of evaluation and management by telemedicine. The patient expressed understanding and agreed to proceed.  Vital Signs: Because this visit was a virtual/telehealth visit, some criteria may be missing or patient reported. Any vitals not documented were not able to be obtained and vitals that have been documented are patient reported.  VideoDeclined- This patient declined Librarian, academic. Therefore the visit was completed with audio only.  Persons Participating in Visit: Patient.  AWV Questionnaire: No: Patient Medicare AWV questionnaire was not completed prior to this visit.  Cardiac Risk Factors include: advanced age (>61men, >32 women);dyslipidemia     Objective:     Today's Vitals   07/14/23 1121  BP: 132/66  Pulse: 76  Weight: 142 lb (64.4 kg)  Height: 5\' 2"  (1.575 m)   Body mass index is 25.97 kg/m.     07/14/2023   11:26 AM 03/04/2022    8:27 AM 03/01/2021    8:18 AM 04/12/2019    8:25 AM 04/09/2018    8:51 AM 07/21/2016    4:59 PM 07/21/2016    4:04 PM  Advanced Directives  Does Patient Have a Medical Advance Directive? Yes Yes Yes Yes No  No  Type of Advance Directive Living will Healthcare Power of [place];Living will Healthcare Power of [place];Living will Healthcare Power of [place];Living will     Does patient want to make changes  to medical advance directive?  No - Patient declined       Copy of Healthcare Power of Attorney in Chart?  Yes - validated most recent copy scanned in chart (See row information) No - copy requested No - copy requested     Would patient like information on creating a medical advance directive?     Yes (MAU/Ambulatory/Procedural Areas - Information given) Yes (ED - Information included in AVS)     Current Medications (verified) Outpatient Encounter Medications as of 07/14/2023  Medication Sig   gabapentin  (NEURONTIN ) 300 MG capsule Take 1 capsule (300 mg total) by mouth at bedtime.   Multiple Vitamins-Minerals (MULTIPLE VITAMINS/WOMENS PO) Take 1 tablet by mouth daily.   No facility-administered encounter medications on file as of 07/14/2023.    Allergies (verified) Patient has no known allergies.   History: History reviewed. No pertinent past medical history. Past Surgical History:  Procedure Laterality Date   CATARACT EXTRACTION     LIPOMA EXCISION     Family History  Problem Relation Age of Onset   Cancer Mother        breast   Cancer Father        Lung   Heart disease Father    Diabetes Brother    Hyperlipidemia Daughter    Heart disease Maternal Grandfather    Heart disease Paternal Grandfather    Social History   Socioeconomic History   Marital status: Widowed    Spouse name: Not on file   Number of children:  2   Years of education: 37   Highest education level: 12th grade  Occupational History   Occupation: Retired    Comment: [employer]   Tobacco Use   Smoking status: Never   Smokeless tobacco: Never  Vaping Use   Vaping status: Never Used  Substance and Sexual Activity   Alcohol use: No    Alcohol/week: 0.0 standard drinks of alcohol   Drug use: No   Sexual activity: Not Currently  Other Topics Concern   Not on file  Social History Narrative   Lives alone - has a basement   Son in [place]   Social Drivers of Health   Financial Resource  Strain: Low Risk  (07/14/2023)   Overall Financial Resource Strain (CARDIA)    Difficulty of Paying Living Expenses: Not hard at all  Food Insecurity: No Food Insecurity (07/14/2023)   Hunger Vital Sign    Worried About Running Out of Food in the Last Year: Never true    Ran Out of Food in the Last Year: Never true  Transportation Needs: No Transportation Needs (07/14/2023)   PRAPARE - Administrator, Civil Service (Medical): No    Lack of Transportation (Non-Medical): No  Physical Activity: Sufficiently Active (07/14/2023)   Exercise Vital Sign    Days of Exercise per Week: 6 days    Minutes of Exercise per Session: 60 min  Stress: No Stress Concern Present (07/14/2023)   [employer] of Occupational Health - Occupational Stress Questionnaire    Feeling of Stress : Not at all  Social Connections: Moderately Integrated (07/14/2023)   Social Connection and Isolation Panel [NHANES]    Frequency of Communication with Friends and Family: More than three times a week    Frequency of Social Gatherings with Friends and Family: More than three times a week    Attends Religious Services: More than 4 times per year    Active Member of [employer] or Organizations: Yes    Attends Banker Meetings: More than 4 times per year    Marital Status: Widowed    Tobacco Counseling Counseling given: Yes    Clinical Intake:  Pre-visit preparation completed: Yes  Pain : No/denies pain     BMI - recorded: 25.97 Nutritional Status: BMI 25 -29 Overweight Nutritional Risks: None Diabetes: No  No results found for: "HGBA1C"   How often do you need to have someone help you when you read instructions, pamphlets, or other written materials from your doctor or pharmacy?: 1 - Never  Interpreter Needed?: No  Information entered by :: Alia t/cma   Activities of Daily Living     07/14/2023   11:24 AM  In your present state of health, do you have any difficulty performing the  following activities:  Hearing? 0  Vision? 0  Difficulty concentrating or making decisions? 0  Walking or climbing stairs? 0  Dressing or bathing? 0  Doing errands, shopping? 0  Preparing Food and eating ? N  Using the Toilet? N  In the past six months, have you accidently leaked urine? Y  Do you have problems with loss of bowel control? N  Managing your Medications? N  Managing your Finances? N  Housekeeping or managing your Housekeeping? N    Patient Care Team: Yevette Hem, FNP as PCP - General (Family Medicine) Julio Ohm, DC as Referring Physician (Chiropractic Medicine) Anderson, Dena D, PA-C (Physician Assistant) Amanda Jungling, MD as Consulting Physician (Diagnostic Radiology) Princella Brooklyn,  OD (Optometry)  I have updated your Care Teams any recent Medical Services you may have received from other providers in the past year.     Assessment:    This is a routine wellness examination for [place].  Hearing/Vision screen Hearing Screening - Comments:: Pt denies hearing dif Vision Screening - Comments:: Pt wears reader/denies vision dif/pt goes to [facility] in [place], [place] last ov a yr ago   Goals Addressed             This Visit's Progress    Patient Stated       Would like to loose at some wt ie 5lbs       Depression Screen     07/14/2023   11:29 AM 04/07/2023   11:42 AM 03/04/2022    8:25 AM 08/20/2021   10:35 AM 05/31/2021    9:02 AM 03/11/2021    2:43 PM 03/01/2021    8:13 AM  PHQ 2/9 Scores  PHQ - 2 Score 0 0 0 0 0 0 0  PHQ- 9 Score 0 0   0      Fall Risk     07/14/2023   11:23 AM 04/07/2023   11:42 AM 03/11/2022    9:07 AM 03/04/2022    8:24 AM 08/20/2021   10:35 AM  Fall Risk   Falls in the past year? 0 0 0 0 0  Number falls in past yr: 0 0 0 0   Injury with Fall? 0 0 0 0   Risk for fall due to : No Fall Risks No Fall Risks No Fall Risks No Fall Risks   Follow up Falls evaluation completed Falls evaluation completed Falls evaluation  completed Falls prevention discussed     MEDICARE RISK AT HOME:  Medicare Risk at Home Any stairs in or around the home?: Yes If so, are there any without handrails?: Yes Home free of loose throw rugs in walkways, pet beds, electrical cords, etc?: Yes Adequate lighting in your home to reduce risk of falls?: Yes Life alert?: No Use of a cane, walker or w/c?: No Grab bars in the bathroom?: No Shower chair or bench in shower?: No Elevated toilet seat or a handicapped toilet?: No  TIMED UP AND GO:  Was the test performed?  no  Cognitive Function: 6CIT completed    04/09/2018    8:53 AM 07/21/2016    4:07 PM  MMSE - Mini Mental State Exam  Orientation to time 5 5  Orientation to Place 5 5  Registration 3 3  Attention/ Calculation 5 5  Recall 3 3  Language- name 2 objects 2 2  Language- repeat 1 1  Language- follow 3 step command 3 3  Language- read & follow direction 1 1  Write a sentence 1 1  Copy design 1 1  Total score 30 30        07/14/2023   11:29 AM 03/04/2022    8:27 AM 03/01/2021    8:19 AM 04/12/2019    8:29 AM  6CIT Screen  What Year? 0 points 0 points 0 points 0 points  What month? 0 points 0 points 0 points 0 points  What time? 0 points 0 points 0 points 0 points  Count back from 20 0 points 0 points 0 points 0 points  Months in reverse 0 points 0 points 0 points 0 points  Repeat phrase 0 points 0 points 0 points 0 points  Total Score 0 points 0 points 0  points 0 points    Immunizations  There is no immunization history on file for this patient.  Screening Tests Health Maintenance  Topic Date Due   Zoster Vaccines- Shingrix (1 of 2) Never done   DEXA SCAN  05/07/2023   DTaP/Tdap/Td (1 - Tdap) 04/06/2024 (Originally 04/13/1964)   Pneumonia Vaccine 25+ Years old (1 of 1 - PCV) 04/06/2024 (Originally 04/13/1994)   COVID-19 Vaccine (1 - 2024-25 season) 07/29/2024 (Originally 10/05/2022)   INFLUENZA VACCINE  09/04/2023   Medicare Annual Wellness (AWV)   07/13/2024   Hepatitis C Screening  Completed   HPV VACCINES  Aged Out   Meningococcal B Vaccine  Aged Out    Health Maintenance  Health Maintenance Due  Topic Date Due   Zoster Vaccines- Shingrix (1 of 2) Never done   DEXA SCAN  05/07/2023   Health Maintenance Items Addressed: See Nurse Notes at the end of this note  Additional Screening:  Vision Screening: Recommended annual ophthalmology exams for early detection of glaucoma and other disorders of the eye. Would you like a referral to an eye doctor? No    Dental Screening: Recommended annual dental exams for proper oral hygiene  Community Resource Referral / Chronic Care Management: CRR required this visit?  Yes   CCM required this visit?  No   Plan:    I have personally reviewed and noted the following in the patient's chart:   Medical and social history Use of alcohol, tobacco or illicit drugs  Current medications and supplements including opioid prescriptions. Patient is not currently taking opioid prescriptions. Functional ability and status Nutritional status Physical activity Advanced directives List of other physicians Hospitalizations, surgeries, and ER visits in previous 12 months Vitals Screenings to include cognitive, depression, and falls Referrals and appointments  In addition, I have reviewed and discussed with patient certain preventive protocols, quality metrics, and best practice recommendations. A written personalized care plan for preventive services as well as general preventive health recommendations were provided to patient.   Michaelle Adolphus, CMA   07/14/2023   After Visit Summary: (MyChart) Due to this being a telephonic visit, the after visit summary with patients personalized plan was offered to patient via MyChart   Notes: Pt is aware and agreed to getting the Dexa scan done. Ordered placed.

## 2023-07-27 ENCOUNTER — Ambulatory Visit (INDEPENDENT_AMBULATORY_CARE_PROVIDER_SITE_OTHER)

## 2023-07-27 DIAGNOSIS — Z780 Asymptomatic menopausal state: Secondary | ICD-10-CM | POA: Diagnosis not present

## 2023-07-27 DIAGNOSIS — Z13820 Encounter for screening for osteoporosis: Secondary | ICD-10-CM

## 2023-07-30 ENCOUNTER — Ambulatory Visit: Payer: Self-pay | Admitting: Family

## 2023-07-30 ENCOUNTER — Other Ambulatory Visit: Payer: Self-pay | Admitting: Family

## 2023-07-30 DIAGNOSIS — Z780 Asymptomatic menopausal state: Secondary | ICD-10-CM | POA: Diagnosis not present

## 2023-07-30 DIAGNOSIS — M8589 Other specified disorders of bone density and structure, multiple sites: Secondary | ICD-10-CM | POA: Diagnosis not present

## 2023-07-31 DIAGNOSIS — M47816 Spondylosis without myelopathy or radiculopathy, lumbar region: Secondary | ICD-10-CM | POA: Diagnosis not present

## 2023-08-12 DIAGNOSIS — M47816 Spondylosis without myelopathy or radiculopathy, lumbar region: Secondary | ICD-10-CM | POA: Diagnosis not present

## 2023-09-02 DIAGNOSIS — M9903 Segmental and somatic dysfunction of lumbar region: Secondary | ICD-10-CM | POA: Diagnosis not present

## 2023-09-02 DIAGNOSIS — M5432 Sciatica, left side: Secondary | ICD-10-CM | POA: Diagnosis not present

## 2023-09-02 DIAGNOSIS — M5431 Sciatica, right side: Secondary | ICD-10-CM | POA: Diagnosis not present

## 2023-09-07 DIAGNOSIS — M9903 Segmental and somatic dysfunction of lumbar region: Secondary | ICD-10-CM | POA: Diagnosis not present

## 2023-09-07 DIAGNOSIS — M5432 Sciatica, left side: Secondary | ICD-10-CM | POA: Diagnosis not present

## 2023-09-07 DIAGNOSIS — M47816 Spondylosis without myelopathy or radiculopathy, lumbar region: Secondary | ICD-10-CM | POA: Diagnosis not present

## 2023-09-07 DIAGNOSIS — M5431 Sciatica, right side: Secondary | ICD-10-CM | POA: Diagnosis not present

## 2023-09-08 DIAGNOSIS — M9903 Segmental and somatic dysfunction of lumbar region: Secondary | ICD-10-CM | POA: Diagnosis not present

## 2023-09-08 DIAGNOSIS — M5432 Sciatica, left side: Secondary | ICD-10-CM | POA: Diagnosis not present

## 2023-09-08 DIAGNOSIS — M5431 Sciatica, right side: Secondary | ICD-10-CM | POA: Diagnosis not present

## 2023-09-09 DIAGNOSIS — M5432 Sciatica, left side: Secondary | ICD-10-CM | POA: Diagnosis not present

## 2023-09-09 DIAGNOSIS — M5431 Sciatica, right side: Secondary | ICD-10-CM | POA: Diagnosis not present

## 2023-09-09 DIAGNOSIS — M9903 Segmental and somatic dysfunction of lumbar region: Secondary | ICD-10-CM | POA: Diagnosis not present

## 2023-09-14 DIAGNOSIS — M5432 Sciatica, left side: Secondary | ICD-10-CM | POA: Diagnosis not present

## 2023-09-14 DIAGNOSIS — M9903 Segmental and somatic dysfunction of lumbar region: Secondary | ICD-10-CM | POA: Diagnosis not present

## 2023-09-14 DIAGNOSIS — M5431 Sciatica, right side: Secondary | ICD-10-CM | POA: Diagnosis not present

## 2023-09-15 DIAGNOSIS — M5432 Sciatica, left side: Secondary | ICD-10-CM | POA: Diagnosis not present

## 2023-09-15 DIAGNOSIS — M9903 Segmental and somatic dysfunction of lumbar region: Secondary | ICD-10-CM | POA: Diagnosis not present

## 2023-09-15 DIAGNOSIS — M5431 Sciatica, right side: Secondary | ICD-10-CM | POA: Diagnosis not present

## 2023-09-16 DIAGNOSIS — M5432 Sciatica, left side: Secondary | ICD-10-CM | POA: Diagnosis not present

## 2023-09-16 DIAGNOSIS — M9903 Segmental and somatic dysfunction of lumbar region: Secondary | ICD-10-CM | POA: Diagnosis not present

## 2023-09-16 DIAGNOSIS — M5431 Sciatica, right side: Secondary | ICD-10-CM | POA: Diagnosis not present

## 2023-09-21 DIAGNOSIS — M5432 Sciatica, left side: Secondary | ICD-10-CM | POA: Diagnosis not present

## 2023-09-21 DIAGNOSIS — M5431 Sciatica, right side: Secondary | ICD-10-CM | POA: Diagnosis not present

## 2023-09-21 DIAGNOSIS — M9903 Segmental and somatic dysfunction of lumbar region: Secondary | ICD-10-CM | POA: Diagnosis not present

## 2023-09-23 DIAGNOSIS — M5431 Sciatica, right side: Secondary | ICD-10-CM | POA: Diagnosis not present

## 2023-09-23 DIAGNOSIS — M9903 Segmental and somatic dysfunction of lumbar region: Secondary | ICD-10-CM | POA: Diagnosis not present

## 2023-09-23 DIAGNOSIS — M5432 Sciatica, left side: Secondary | ICD-10-CM | POA: Diagnosis not present

## 2023-09-28 DIAGNOSIS — M5431 Sciatica, right side: Secondary | ICD-10-CM | POA: Diagnosis not present

## 2023-09-28 DIAGNOSIS — M5432 Sciatica, left side: Secondary | ICD-10-CM | POA: Diagnosis not present

## 2023-09-28 DIAGNOSIS — M9903 Segmental and somatic dysfunction of lumbar region: Secondary | ICD-10-CM | POA: Diagnosis not present

## 2023-09-30 DIAGNOSIS — M5431 Sciatica, right side: Secondary | ICD-10-CM | POA: Diagnosis not present

## 2023-09-30 DIAGNOSIS — M9903 Segmental and somatic dysfunction of lumbar region: Secondary | ICD-10-CM | POA: Diagnosis not present

## 2023-09-30 DIAGNOSIS — M5432 Sciatica, left side: Secondary | ICD-10-CM | POA: Diagnosis not present

## 2023-10-06 DIAGNOSIS — M9903 Segmental and somatic dysfunction of lumbar region: Secondary | ICD-10-CM | POA: Diagnosis not present

## 2023-10-06 DIAGNOSIS — M5432 Sciatica, left side: Secondary | ICD-10-CM | POA: Diagnosis not present

## 2023-10-06 DIAGNOSIS — M5431 Sciatica, right side: Secondary | ICD-10-CM | POA: Diagnosis not present

## 2023-10-07 DIAGNOSIS — M5431 Sciatica, right side: Secondary | ICD-10-CM | POA: Diagnosis not present

## 2023-10-07 DIAGNOSIS — M9903 Segmental and somatic dysfunction of lumbar region: Secondary | ICD-10-CM | POA: Diagnosis not present

## 2023-10-07 DIAGNOSIS — M5432 Sciatica, left side: Secondary | ICD-10-CM | POA: Diagnosis not present

## 2023-10-21 DIAGNOSIS — M9903 Segmental and somatic dysfunction of lumbar region: Secondary | ICD-10-CM | POA: Diagnosis not present

## 2023-10-21 DIAGNOSIS — M5431 Sciatica, right side: Secondary | ICD-10-CM | POA: Diagnosis not present

## 2023-10-21 DIAGNOSIS — M5432 Sciatica, left side: Secondary | ICD-10-CM | POA: Diagnosis not present

## 2023-11-04 DIAGNOSIS — M9903 Segmental and somatic dysfunction of lumbar region: Secondary | ICD-10-CM | POA: Diagnosis not present

## 2023-11-04 DIAGNOSIS — M5432 Sciatica, left side: Secondary | ICD-10-CM | POA: Diagnosis not present

## 2023-11-04 DIAGNOSIS — M5431 Sciatica, right side: Secondary | ICD-10-CM | POA: Diagnosis not present

## 2023-11-18 DIAGNOSIS — M9903 Segmental and somatic dysfunction of lumbar region: Secondary | ICD-10-CM | POA: Diagnosis not present

## 2023-11-18 DIAGNOSIS — M5432 Sciatica, left side: Secondary | ICD-10-CM | POA: Diagnosis not present

## 2023-11-18 DIAGNOSIS — M5431 Sciatica, right side: Secondary | ICD-10-CM | POA: Diagnosis not present

## 2023-12-02 DIAGNOSIS — M5431 Sciatica, right side: Secondary | ICD-10-CM | POA: Diagnosis not present

## 2023-12-02 DIAGNOSIS — M5432 Sciatica, left side: Secondary | ICD-10-CM | POA: Diagnosis not present

## 2023-12-02 DIAGNOSIS — M9903 Segmental and somatic dysfunction of lumbar region: Secondary | ICD-10-CM | POA: Diagnosis not present

## 2023-12-16 DIAGNOSIS — M5432 Sciatica, left side: Secondary | ICD-10-CM | POA: Diagnosis not present

## 2023-12-16 DIAGNOSIS — M5431 Sciatica, right side: Secondary | ICD-10-CM | POA: Diagnosis not present

## 2023-12-16 DIAGNOSIS — M9903 Segmental and somatic dysfunction of lumbar region: Secondary | ICD-10-CM | POA: Diagnosis not present

## 2023-12-25 DIAGNOSIS — M48062 Spinal stenosis, lumbar region with neurogenic claudication: Secondary | ICD-10-CM | POA: Diagnosis not present

## 2024-01-05 DIAGNOSIS — M5432 Sciatica, left side: Secondary | ICD-10-CM | POA: Diagnosis not present

## 2024-01-05 DIAGNOSIS — M9903 Segmental and somatic dysfunction of lumbar region: Secondary | ICD-10-CM | POA: Diagnosis not present

## 2024-01-05 DIAGNOSIS — M5431 Sciatica, right side: Secondary | ICD-10-CM | POA: Diagnosis not present

## 2024-01-06 DIAGNOSIS — M48062 Spinal stenosis, lumbar region with neurogenic claudication: Secondary | ICD-10-CM | POA: Diagnosis not present

## 2024-04-08 ENCOUNTER — Encounter: Payer: Self-pay | Admitting: Family

## 2024-07-14 ENCOUNTER — Ambulatory Visit: Payer: Self-pay

## 2024-07-22 ENCOUNTER — Ambulatory Visit
